# Patient Record
Sex: Male | Born: 1970 | Race: White | Hispanic: No | Marital: Married | Smoking: Never smoker
Health system: Southern US, Community
[De-identification: ages and names within clinical notes are randomized; demographics above are authoritative.]

## PROBLEM LIST (undated history)

## (undated) DIAGNOSIS — R55 Syncope and collapse: Secondary | ICD-10-CM

## (undated) DIAGNOSIS — C6290 Malignant neoplasm of unspecified testis, unspecified whether descended or undescended: Secondary | ICD-10-CM

## (undated) DIAGNOSIS — Z9221 Personal history of antineoplastic chemotherapy: Secondary | ICD-10-CM

## (undated) DIAGNOSIS — D499 Neoplasm of unspecified behavior of unspecified site: Secondary | ICD-10-CM

## (undated) HISTORY — PX: CARDIAC CATHETERIZATION: SHX172

## (undated) HISTORY — DX: Personal history of antineoplastic chemotherapy: Z92.21

## (undated) HISTORY — PX: WISDOM TOOTH EXTRACTION: SHX21

## (undated) HISTORY — PX: OTHER SURGICAL HISTORY: SHX169

## (undated) HISTORY — DX: Malignant neoplasm of unspecified testis, unspecified whether descended or undescended: C62.90

## (undated) HISTORY — DX: Neoplasm of unspecified behavior of unspecified site: D49.9

## (undated) HISTORY — DX: Syncope and collapse: R55

---

## 1998-07-31 ENCOUNTER — Encounter: Payer: Self-pay | Admitting: Emergency Medicine

## 1998-07-31 ENCOUNTER — Emergency Department (HOSPITAL_COMMUNITY): Admission: EM | Admit: 1998-07-31 | Discharge: 1998-07-31 | Payer: Self-pay | Admitting: Emergency Medicine

## 2005-11-29 ENCOUNTER — Ambulatory Visit (HOSPITAL_COMMUNITY): Admission: RE | Admit: 2005-11-29 | Discharge: 2005-11-29 | Payer: Self-pay | Admitting: Urology

## 2005-11-29 ENCOUNTER — Encounter (INDEPENDENT_AMBULATORY_CARE_PROVIDER_SITE_OTHER): Payer: Self-pay | Admitting: Specialist

## 2005-12-12 ENCOUNTER — Ambulatory Visit (HOSPITAL_COMMUNITY): Admission: RE | Admit: 2005-12-12 | Discharge: 2005-12-12 | Payer: Self-pay | Admitting: Urology

## 2005-12-21 ENCOUNTER — Ambulatory Visit: Admission: RE | Admit: 2005-12-21 | Discharge: 2006-02-25 | Payer: Self-pay | Admitting: Radiation Oncology

## 2007-01-30 ENCOUNTER — Ambulatory Visit (HOSPITAL_COMMUNITY): Admission: RE | Admit: 2007-01-30 | Discharge: 2007-01-30 | Payer: Self-pay | Admitting: Urology

## 2007-02-03 ENCOUNTER — Ambulatory Visit: Payer: Self-pay | Admitting: Oncology

## 2007-02-11 LAB — CBC WITH DIFFERENTIAL/PLATELET
BASO%: 0.5 % (ref 0.0–2.0)
EOS%: 4.4 % (ref 0.0–7.0)
HGB: 14.9 g/dL (ref 13.0–17.1)
MCH: 31.3 pg (ref 28.0–33.4)
MCHC: 35.6 g/dL (ref 32.0–35.9)
RDW: 13 % (ref 11.2–14.6)
WBC: 5.1 10*3/uL (ref 4.0–10.0)
lymph#: 1 10*3/uL (ref 0.9–3.3)

## 2007-02-15 LAB — COMPREHENSIVE METABOLIC PANEL
ALT: 34 U/L (ref 0–53)
AST: 23 U/L (ref 0–37)
Albumin: 4.7 g/dL (ref 3.5–5.2)
Calcium: 9.7 mg/dL (ref 8.4–10.5)
Chloride: 105 mEq/L (ref 96–112)
Potassium: 4.2 mEq/L (ref 3.5–5.3)
Sodium: 141 mEq/L (ref 135–145)
Total Protein: 7.5 g/dL (ref 6.0–8.3)

## 2007-02-15 LAB — AFP TUMOR MARKER: AFP-Tumor Marker: 3.8 ng/mL (ref 0.0–8.0)

## 2007-02-17 ENCOUNTER — Ambulatory Visit: Admission: RE | Admit: 2007-02-17 | Discharge: 2007-03-18 | Payer: Self-pay | Admitting: Radiation Oncology

## 2007-03-19 ENCOUNTER — Ambulatory Visit: Admission: RE | Admit: 2007-03-19 | Discharge: 2007-05-24 | Payer: Self-pay | Admitting: Radiation Oncology

## 2007-04-03 ENCOUNTER — Ambulatory Visit (HOSPITAL_COMMUNITY): Admission: RE | Admit: 2007-04-03 | Discharge: 2007-04-03 | Payer: Self-pay | Admitting: Radiation Oncology

## 2007-04-06 LAB — CBC WITH DIFFERENTIAL/PLATELET
Basophils Absolute: 0 10*3/uL (ref 0.0–0.1)
EOS%: 4 % (ref 0.0–7.0)
Eosinophils Absolute: 0.2 10*3/uL (ref 0.0–0.5)
HGB: 14.8 g/dL (ref 13.0–17.1)
LYMPH%: 13.9 % — ABNORMAL LOW (ref 14.0–48.0)
MCH: 31.1 pg (ref 28.0–33.4)
MCV: 87.2 fL (ref 81.6–98.0)
MONO%: 11.4 % (ref 0.0–13.0)
NEUT#: 4.3 10*3/uL (ref 1.5–6.5)
NEUT%: 70.5 % (ref 40.0–75.0)
Platelets: 249 10*3/uL (ref 145–400)

## 2007-04-06 LAB — BASIC METABOLIC PANEL
BUN: 10 mg/dL (ref 6–23)
Calcium: 9.4 mg/dL (ref 8.4–10.5)
Creatinine, Ser: 0.87 mg/dL (ref 0.40–1.50)
Glucose, Bld: 94 mg/dL (ref 70–99)
Potassium: 4.4 mEq/L (ref 3.5–5.3)

## 2007-05-07 LAB — CBC WITH DIFFERENTIAL/PLATELET
BASO%: 0.1 % (ref 0.0–2.0)
EOS%: 2.2 % (ref 0.0–7.0)
HCT: 41.4 % (ref 38.7–49.9)
LYMPH%: 5.5 % — ABNORMAL LOW (ref 14.0–48.0)
MCH: 31.3 pg (ref 28.0–33.4)
MCHC: 35.7 g/dL (ref 32.0–35.9)
NEUT%: 80.5 % — ABNORMAL HIGH (ref 40.0–75.0)
RBC: 4.72 10*6/uL (ref 4.20–5.71)
lymph#: 0.3 10*3/uL — ABNORMAL LOW (ref 0.9–3.3)

## 2007-10-21 ENCOUNTER — Encounter (INDEPENDENT_AMBULATORY_CARE_PROVIDER_SITE_OTHER): Payer: Self-pay | Admitting: Interventional Radiology

## 2007-10-21 ENCOUNTER — Ambulatory Visit (HOSPITAL_COMMUNITY): Admission: RE | Admit: 2007-10-21 | Discharge: 2007-10-21 | Payer: Self-pay | Admitting: Urology

## 2007-10-27 ENCOUNTER — Ambulatory Visit: Payer: Self-pay | Admitting: Oncology

## 2007-11-02 ENCOUNTER — Ambulatory Visit: Admission: RE | Admit: 2007-11-02 | Discharge: 2007-11-02 | Payer: Self-pay | Admitting: Oncology

## 2007-11-06 ENCOUNTER — Ambulatory Visit (HOSPITAL_COMMUNITY): Admission: RE | Admit: 2007-11-06 | Discharge: 2007-11-06 | Payer: Self-pay | Admitting: Oncology

## 2007-11-16 LAB — CBC WITH DIFFERENTIAL/PLATELET
BASO%: 1.2 % (ref 0.0–2.0)
EOS%: 10.1 % — ABNORMAL HIGH (ref 0.0–7.0)
HCT: 40.3 % (ref 38.7–49.9)
LYMPH%: 8.3 % — ABNORMAL LOW (ref 14.0–48.0)
MCH: 30.9 pg (ref 28.0–33.4)
MCHC: 35.1 g/dL (ref 32.0–35.9)
MONO%: 9.9 % (ref 0.0–13.0)
NEUT%: 70.5 % (ref 40.0–75.0)
Platelets: 182 10*3/uL (ref 145–400)

## 2007-11-16 LAB — COMPREHENSIVE METABOLIC PANEL
ALT: 32 U/L (ref 0–53)
AST: 23 U/L (ref 0–37)
CO2: 24 mEq/L (ref 19–32)
Calcium: 9.7 mg/dL (ref 8.4–10.5)
Chloride: 106 mEq/L (ref 96–112)
Creatinine, Ser: 0.77 mg/dL (ref 0.40–1.50)
Potassium: 4.2 mEq/L (ref 3.5–5.3)
Sodium: 138 mEq/L (ref 135–145)
Total Protein: 6.8 g/dL (ref 6.0–8.3)

## 2007-11-24 LAB — CBC WITH DIFFERENTIAL/PLATELET
Basophils Absolute: 0 10*3/uL (ref 0.0–0.1)
EOS%: 0.7 % (ref 0.0–7.0)
Eosinophils Absolute: 0 10*3/uL (ref 0.0–0.5)
HCT: 36.6 % — ABNORMAL LOW (ref 38.7–49.9)
HGB: 13.2 g/dL (ref 13.0–17.1)
MCH: 30.8 pg (ref 28.0–33.4)
MONO#: 0.1 10*3/uL (ref 0.1–0.9)
NEUT#: 5.3 10*3/uL (ref 1.5–6.5)
NEUT%: 92.3 % — ABNORMAL HIGH (ref 40.0–75.0)
lymph#: 0.3 10*3/uL — ABNORMAL LOW (ref 0.9–3.3)

## 2007-11-24 LAB — COMPREHENSIVE METABOLIC PANEL
Albumin: 4.3 g/dL (ref 3.5–5.2)
BUN: 20 mg/dL (ref 6–23)
CO2: 21 mEq/L (ref 19–32)
Calcium: 9 mg/dL (ref 8.4–10.5)
Chloride: 101 mEq/L (ref 96–112)
Creatinine, Ser: 0.81 mg/dL (ref 0.40–1.50)
Glucose, Bld: 124 mg/dL — ABNORMAL HIGH (ref 70–99)
Potassium: 3.9 mEq/L (ref 3.5–5.3)

## 2007-11-26 LAB — CBC WITH DIFFERENTIAL/PLATELET
BASO%: 1.3 % (ref 0.0–2.0)
Basophils Absolute: 0 10*3/uL (ref 0.0–0.1)
EOS%: 2.2 % (ref 0.0–7.0)
HCT: 33.9 % — ABNORMAL LOW (ref 38.7–49.9)
HGB: 12.1 g/dL — ABNORMAL LOW (ref 13.0–17.1)
MONO#: 0 10*3/uL — ABNORMAL LOW (ref 0.1–0.9)
NEUT%: 65.2 % (ref 40.0–75.0)
RDW: 12.7 % (ref 11.2–14.6)
WBC: 0.8 10*3/uL — CL (ref 4.0–10.0)
lymph#: 0.2 10*3/uL — ABNORMAL LOW (ref 0.9–3.3)

## 2007-11-26 LAB — COMPREHENSIVE METABOLIC PANEL
AST: 21 U/L (ref 0–37)
Albumin: 4.2 g/dL (ref 3.5–5.2)
BUN: 16 mg/dL (ref 6–23)
Calcium: 9.2 mg/dL (ref 8.4–10.5)
Chloride: 104 mEq/L (ref 96–112)
Glucose, Bld: 129 mg/dL — ABNORMAL HIGH (ref 70–99)
Potassium: 4.4 mEq/L (ref 3.5–5.3)

## 2007-12-01 LAB — CBC WITH DIFFERENTIAL/PLATELET
BASO%: 0.7 % (ref 0.0–2.0)
Basophils Absolute: 0 10*3/uL (ref 0.0–0.1)
EOS%: 1.2 % (ref 0.0–7.0)
HGB: 12.4 g/dL — ABNORMAL LOW (ref 13.0–17.1)
MCH: 31.7 pg (ref 28.0–33.4)
RDW: 13.1 % (ref 11.2–14.6)
lymph#: 0.3 10*3/uL — ABNORMAL LOW (ref 0.9–3.3)

## 2007-12-02 ENCOUNTER — Ambulatory Visit: Admission: RE | Admit: 2007-12-02 | Discharge: 2007-12-02 | Payer: Self-pay | Admitting: Oncology

## 2007-12-05 LAB — COMPREHENSIVE METABOLIC PANEL
ALT: 48 U/L (ref 0–53)
AST: 30 U/L (ref 0–37)
Albumin: 4.2 g/dL (ref 3.5–5.2)
BUN: 12 mg/dL (ref 6–23)
Calcium: 9.2 mg/dL (ref 8.4–10.5)
Chloride: 105 mEq/L (ref 96–112)
Potassium: 4.2 mEq/L (ref 3.5–5.3)
Sodium: 140 mEq/L (ref 135–145)
Total Protein: 6.5 g/dL (ref 6.0–8.3)

## 2007-12-07 ENCOUNTER — Ambulatory Visit: Payer: Self-pay | Admitting: Oncology

## 2007-12-07 LAB — CBC WITH DIFFERENTIAL/PLATELET
BASO%: 0.4 % (ref 0.0–2.0)
LYMPH%: 7.5 % — ABNORMAL LOW (ref 14.0–48.0)
MCH: 31.5 pg (ref 28.0–33.4)
MCHC: 35.9 g/dL (ref 32.0–35.9)
MCV: 87.8 fL (ref 81.6–98.0)
MONO%: 5.8 % (ref 0.0–13.0)
NEUT%: 84.5 % — ABNORMAL HIGH (ref 40.0–75.0)
Platelets: 193 10*3/uL (ref 145–400)
RBC: 4.14 10*6/uL — ABNORMAL LOW (ref 4.20–5.71)

## 2007-12-09 LAB — COMPREHENSIVE METABOLIC PANEL
ALT: 51 U/L (ref 0–53)
Alkaline Phosphatase: 54 U/L (ref 39–117)
Creatinine, Ser: 0.83 mg/dL (ref 0.40–1.50)
Glucose, Bld: 146 mg/dL — ABNORMAL HIGH (ref 70–99)
Sodium: 139 mEq/L (ref 135–145)
Total Bilirubin: 0.3 mg/dL (ref 0.3–1.2)
Total Protein: 6.7 g/dL (ref 6.0–8.3)

## 2007-12-09 LAB — AFP TUMOR MARKER: AFP-Tumor Marker: 5.4 ng/mL (ref 0.0–8.0)

## 2007-12-15 LAB — CBC WITH DIFFERENTIAL/PLATELET
BASO%: 0.1 % (ref 0.0–2.0)
Basophils Absolute: 0 10*3/uL (ref 0.0–0.1)
EOS%: 0.1 % (ref 0.0–7.0)
HGB: 11.8 g/dL — ABNORMAL LOW (ref 13.0–17.1)
MCH: 31.6 pg (ref 28.0–33.4)
MONO#: 0.1 10*3/uL (ref 0.1–0.9)
RDW: 13.3 % (ref 11.2–14.6)
WBC: 17.5 10*3/uL — ABNORMAL HIGH (ref 4.0–10.0)
lymph#: 0.3 10*3/uL — ABNORMAL LOW (ref 0.9–3.3)

## 2007-12-17 LAB — COMPREHENSIVE METABOLIC PANEL
ALT: 30 U/L (ref 0–53)
AST: 14 U/L (ref 0–37)
Albumin: 4.1 g/dL (ref 3.5–5.2)
BUN: 20 mg/dL (ref 6–23)
CO2: 24 mEq/L (ref 19–32)
Calcium: 9.3 mg/dL (ref 8.4–10.5)
Chloride: 97 mEq/L (ref 96–112)
Potassium: 3.9 mEq/L (ref 3.5–5.3)

## 2007-12-21 ENCOUNTER — Ambulatory Visit: Admission: RE | Admit: 2007-12-21 | Discharge: 2007-12-21 | Payer: Self-pay | Admitting: Oncology

## 2007-12-22 LAB — CBC WITH DIFFERENTIAL/PLATELET
BASO%: 0.4 % (ref 0.0–2.0)
Basophils Absolute: 0 10*3/uL (ref 0.0–0.1)
Eosinophils Absolute: 0 10*3/uL (ref 0.0–0.5)
HCT: 29.1 % — ABNORMAL LOW (ref 38.7–49.9)
HGB: 10.4 g/dL — ABNORMAL LOW (ref 13.0–17.1)
MONO#: 0.4 10*3/uL (ref 0.1–0.9)
NEUT#: 3.5 10*3/uL (ref 1.5–6.5)
NEUT%: 82.3 % — ABNORMAL HIGH (ref 40.0–75.0)
WBC: 4.2 10*3/uL (ref 4.0–10.0)
lymph#: 0.3 10*3/uL — ABNORMAL LOW (ref 0.9–3.3)

## 2007-12-25 LAB — LACTATE DEHYDROGENASE: LDH: 355 U/L — ABNORMAL HIGH (ref 94–250)

## 2007-12-25 LAB — COMPREHENSIVE METABOLIC PANEL
ALT: 37 U/L (ref 0–53)
BUN: 9 mg/dL (ref 6–23)
CO2: 23 mEq/L (ref 19–32)
Calcium: 9.3 mg/dL (ref 8.4–10.5)
Chloride: 108 mEq/L (ref 96–112)
Creatinine, Ser: 0.87 mg/dL (ref 0.40–1.50)
Glucose, Bld: 90 mg/dL (ref 70–99)

## 2007-12-25 LAB — AFP TUMOR MARKER: AFP-Tumor Marker: 4.9 ng/mL (ref 0.0–8.0)

## 2007-12-28 LAB — CBC WITH DIFFERENTIAL/PLATELET
Basophils Absolute: 0.1 10*3/uL (ref 0.0–0.1)
HCT: 30.8 % — ABNORMAL LOW (ref 38.7–49.9)
HGB: 10.9 g/dL — ABNORMAL LOW (ref 13.0–17.1)
MONO#: 0.8 10*3/uL (ref 0.1–0.9)
NEUT%: 86.8 % — ABNORMAL HIGH (ref 40.0–75.0)
WBC: 11.3 10*3/uL — ABNORMAL HIGH (ref 4.0–10.0)
lymph#: 0.6 10*3/uL — ABNORMAL LOW (ref 0.9–3.3)

## 2007-12-30 LAB — BETA HCG QUANT (REF LAB): Beta hCG, Tumor Marker: 0.5 m[IU]/mL (ref <5.0)

## 2007-12-30 LAB — COMPREHENSIVE METABOLIC PANEL
Albumin: 4.1 g/dL (ref 3.5–5.2)
Alkaline Phosphatase: 55 U/L (ref 39–117)
BUN: 14 mg/dL (ref 6–23)
Calcium: 9.2 mg/dL (ref 8.4–10.5)
Chloride: 106 mEq/L (ref 96–112)
Glucose, Bld: 116 mg/dL — ABNORMAL HIGH (ref 70–99)
Potassium: 3.9 mEq/L (ref 3.5–5.3)
Sodium: 139 mEq/L (ref 135–145)
Total Protein: 6.4 g/dL (ref 6.0–8.3)

## 2007-12-30 LAB — AFP TUMOR MARKER: AFP-Tumor Marker: 4 ng/mL (ref 0.0–8.0)

## 2008-01-05 LAB — COMPREHENSIVE METABOLIC PANEL
AST: 8 U/L (ref 0–37)
Alkaline Phosphatase: 83 U/L (ref 39–117)
BUN: 19 mg/dL (ref 6–23)
Calcium: 6.9 mg/dL — ABNORMAL LOW (ref 8.4–10.5)
Chloride: 111 mEq/L (ref 96–112)
Creatinine, Ser: 0.67 mg/dL (ref 0.40–1.50)

## 2008-01-05 LAB — CBC WITH DIFFERENTIAL/PLATELET
Basophils Absolute: 0 10*3/uL (ref 0.0–0.1)
EOS%: 0 % (ref 0.0–7.0)
HCT: 28.2 % — ABNORMAL LOW (ref 38.7–49.9)
HGB: 10.1 g/dL — ABNORMAL LOW (ref 13.0–17.1)
MCH: 31.6 pg (ref 28.0–33.4)
MCV: 88.2 fL (ref 81.6–98.0)
MONO%: 1.3 % (ref 0.0–13.0)
NEUT%: 97.3 % — ABNORMAL HIGH (ref 40.0–75.0)
Platelets: 132 10*3/uL — ABNORMAL LOW (ref 145–400)

## 2008-01-12 ENCOUNTER — Ambulatory Visit (HOSPITAL_COMMUNITY): Admission: RE | Admit: 2008-01-12 | Discharge: 2008-01-12 | Payer: Self-pay | Admitting: Oncology

## 2008-01-12 LAB — CBC WITH DIFFERENTIAL/PLATELET
Basophils Absolute: 0 10*3/uL (ref 0.0–0.1)
EOS%: 0.7 % (ref 0.0–7.0)
HCT: 26.1 % — ABNORMAL LOW (ref 38.7–49.9)
HGB: 9.3 g/dL — ABNORMAL LOW (ref 13.0–17.1)
MCH: 32 pg (ref 28.0–33.4)
NEUT%: 75.9 % — ABNORMAL HIGH (ref 40.0–75.0)
lymph#: 0.3 10*3/uL — ABNORMAL LOW (ref 0.9–3.3)

## 2008-01-14 LAB — COMPREHENSIVE METABOLIC PANEL
BUN: 15 mg/dL (ref 6–23)
CO2: 23 mEq/L (ref 19–32)
Calcium: 9.5 mg/dL (ref 8.4–10.5)
Chloride: 105 mEq/L (ref 96–112)
Creatinine, Ser: 0.93 mg/dL (ref 0.40–1.50)
Glucose, Bld: 156 mg/dL — ABNORMAL HIGH (ref 70–99)

## 2008-01-14 LAB — BETA HCG QUANT (REF LAB): Beta hCG, Tumor Marker: 1 m[IU]/mL (ref <5.0)

## 2008-01-14 LAB — LACTATE DEHYDROGENASE: LDH: 310 U/L — ABNORMAL HIGH (ref 94–250)

## 2008-01-19 LAB — CBC WITH DIFFERENTIAL/PLATELET
BASO%: 0.1 % (ref 0.0–2.0)
Basophils Absolute: 0 10*3/uL (ref 0.0–0.1)
EOS%: 3.3 % (ref 0.0–7.0)
HGB: 10 g/dL — ABNORMAL LOW (ref 13.0–17.1)
MCH: 31.8 pg (ref 28.0–33.4)
MCV: 91 fL (ref 81.6–98.0)
MONO%: 9.5 % (ref 0.0–13.0)
RBC: 3.15 10*6/uL — ABNORMAL LOW (ref 4.20–5.71)
RDW: 18.7 % — ABNORMAL HIGH (ref 11.2–14.6)
lymph#: 0.5 10*3/uL — ABNORMAL LOW (ref 0.9–3.3)

## 2008-01-19 LAB — COMPREHENSIVE METABOLIC PANEL
ALT: 27 U/L (ref 0–53)
AST: 20 U/L (ref 0–37)
Albumin: 4.5 g/dL (ref 3.5–5.2)
Alkaline Phosphatase: 61 U/L (ref 39–117)
BUN: 14 mg/dL (ref 6–23)
Calcium: 9.6 mg/dL (ref 8.4–10.5)
Chloride: 104 mEq/L (ref 96–112)
Potassium: 4.2 mEq/L (ref 3.5–5.3)
Sodium: 140 mEq/L (ref 135–145)
Total Protein: 6.7 g/dL (ref 6.0–8.3)

## 2008-02-01 ENCOUNTER — Ambulatory Visit (HOSPITAL_COMMUNITY): Admission: RE | Admit: 2008-02-01 | Discharge: 2008-02-01 | Payer: Self-pay | Admitting: Oncology

## 2008-02-03 ENCOUNTER — Ambulatory Visit: Payer: Self-pay | Admitting: Oncology

## 2008-02-05 LAB — CBC WITH DIFFERENTIAL/PLATELET
BASO%: 0.3 % (ref 0.0–2.0)
Basophils Absolute: 0 10*3/uL (ref 0.0–0.1)
EOS%: 13.8 % — ABNORMAL HIGH (ref 0.0–7.0)
HGB: 11.3 g/dL — ABNORMAL LOW (ref 13.0–17.1)
MCH: 33 pg (ref 28.0–33.4)
MCHC: 34.7 g/dL (ref 32.0–35.9)
MCV: 94.8 fL (ref 81.6–98.0)
MONO%: 6.8 % (ref 0.0–13.0)
RDW: 18.1 % — ABNORMAL HIGH (ref 11.2–14.6)
lymph#: 0.5 10*3/uL — ABNORMAL LOW (ref 0.9–3.3)

## 2008-02-08 LAB — COMPREHENSIVE METABOLIC PANEL
AST: 20 U/L (ref 0–37)
Alkaline Phosphatase: 58 U/L (ref 39–117)
BUN: 18 mg/dL (ref 6–23)
Glucose, Bld: 90 mg/dL (ref 70–99)
Potassium: 4.4 mEq/L (ref 3.5–5.3)
Sodium: 140 mEq/L (ref 135–145)
Total Bilirubin: 0.5 mg/dL (ref 0.3–1.2)
Total Protein: 6.7 g/dL (ref 6.0–8.3)

## 2008-02-08 LAB — BETA HCG QUANT (REF LAB): Beta hCG, Tumor Marker: <0.5 m[IU]/mL (ref <5.0)

## 2008-04-04 ENCOUNTER — Ambulatory Visit: Payer: Self-pay | Admitting: Oncology

## 2008-04-11 ENCOUNTER — Ambulatory Visit: Payer: Self-pay | Admitting: Oncology

## 2008-04-13 LAB — CBC WITH DIFFERENTIAL/PLATELET
Eosinophils Absolute: 0.2 10*3/uL (ref 0.0–0.5)
MONO#: 0.2 10*3/uL (ref 0.1–0.9)
NEUT#: 4.2 10*3/uL (ref 1.5–6.5)
RBC: 4.31 10*6/uL (ref 4.20–5.71)
RDW: 12.7 % (ref 11.2–14.6)
WBC: 5.1 10*3/uL (ref 4.0–10.0)
lymph#: 0.5 10*3/uL — ABNORMAL LOW (ref 0.9–3.3)

## 2008-04-13 LAB — COMPREHENSIVE METABOLIC PANEL
ALT: 31 U/L (ref 0–53)
Albumin: 4.2 g/dL (ref 3.5–5.2)
CO2: 28 mEq/L (ref 19–32)
Glucose, Bld: 138 mg/dL — ABNORMAL HIGH (ref 70–99)
Potassium: 3.8 mEq/L (ref 3.5–5.3)
Sodium: 140 mEq/L (ref 135–145)
Total Protein: 7.2 g/dL (ref 6.0–8.3)

## 2008-04-13 LAB — LACTATE DEHYDROGENASE: LDH: 161 U/L (ref 94–250)

## 2008-04-17 LAB — AFP TUMOR MARKER: AFP-Tumor Marker: 5.2 ng/mL (ref 0.0–8.0)

## 2008-05-23 ENCOUNTER — Ambulatory Visit: Payer: Self-pay | Admitting: Oncology

## 2008-06-07 LAB — CBC WITH DIFFERENTIAL/PLATELET
Basophils Absolute: 0 10*3/uL (ref 0.0–0.1)
Eosinophils Absolute: 0.2 10*3/uL (ref 0.0–0.5)
HGB: 12.4 g/dL — ABNORMAL LOW (ref 13.0–17.1)
LYMPH%: 9.9 % — ABNORMAL LOW (ref 14.0–49.0)
MCV: 89.7 fL (ref 79.3–98.0)
MONO#: 0.3 10*3/uL (ref 0.1–0.9)
MONO%: 5.7 % (ref 0.0–14.0)
NEUT#: 4.3 10*3/uL (ref 1.5–6.5)
Platelets: 145 10*3/uL (ref 140–400)
RBC: 3.98 10*6/uL — ABNORMAL LOW (ref 4.20–5.82)
WBC: 5.4 10*3/uL (ref 4.0–10.3)

## 2008-06-07 LAB — TSH: TSH: 1.395 u[IU]/mL (ref 0.350–4.500)

## 2008-06-07 LAB — T4: T4, Total: 9.8 ug/dL (ref 5.0–12.5)

## 2008-06-10 LAB — COMPREHENSIVE METABOLIC PANEL
AST: 13 U/L (ref 0–37)
Albumin: 4.7 g/dL (ref 3.5–5.2)
Alkaline Phosphatase: 53 U/L (ref 39–117)
BUN: 12 mg/dL (ref 6–23)
Creatinine, Ser: 0.95 mg/dL (ref 0.40–1.50)
Potassium: 4.3 mEq/L (ref 3.5–5.3)

## 2008-06-10 LAB — BETA HCG QUANT (REF LAB): Beta hCG, Tumor Marker: <0.5 m[IU]/mL (ref <5.0)

## 2008-07-08 ENCOUNTER — Ambulatory Visit: Payer: Self-pay | Admitting: Oncology

## 2008-07-12 ENCOUNTER — Ambulatory Visit (HOSPITAL_COMMUNITY): Admission: RE | Admit: 2008-07-12 | Discharge: 2008-07-12 | Payer: Self-pay | Admitting: Oncology

## 2008-07-12 LAB — COMPREHENSIVE METABOLIC PANEL
Alkaline Phosphatase: 54 U/L (ref 39–117)
BUN: 15 mg/dL (ref 6–23)
CO2: 27 mEq/L (ref 19–32)
Creatinine, Ser: 0.9 mg/dL (ref 0.40–1.50)
Glucose, Bld: 86 mg/dL (ref 70–99)
Sodium: 139 mEq/L (ref 135–145)
Total Bilirubin: 0.6 mg/dL (ref 0.3–1.2)
Total Protein: 7.4 g/dL (ref 6.0–8.3)

## 2008-07-12 LAB — CBC WITH DIFFERENTIAL/PLATELET
Basophils Absolute: 0 10*3/uL (ref 0.0–0.1)
Eosinophils Absolute: 0.2 10*3/uL (ref 0.0–0.5)
HCT: 39.2 % (ref 38.4–49.9)
HGB: 13.6 g/dL (ref 13.0–17.1)
LYMPH%: 9.8 % — ABNORMAL LOW (ref 14.0–49.0)
MCV: 90.6 fL (ref 79.3–98.0)
MONO#: 0.4 10*3/uL (ref 0.1–0.9)
MONO%: 6.4 % (ref 0.0–14.0)
NEUT#: 4.7 10*3/uL (ref 1.5–6.5)
NEUT%: 80.9 % — ABNORMAL HIGH (ref 39.0–75.0)
Platelets: 196 10*3/uL (ref 140–400)
RBC: 4.33 10*6/uL (ref 4.20–5.82)
WBC: 5.8 10*3/uL (ref 4.0–10.3)

## 2008-08-17 ENCOUNTER — Ambulatory Visit (HOSPITAL_COMMUNITY): Admission: RE | Admit: 2008-08-17 | Discharge: 2008-08-17 | Payer: Self-pay | Admitting: Oncology

## 2008-11-02 ENCOUNTER — Ambulatory Visit: Payer: Self-pay | Admitting: Oncology

## 2009-01-31 ENCOUNTER — Ambulatory Visit: Payer: Self-pay | Admitting: Oncology

## 2009-02-02 LAB — CBC WITH DIFFERENTIAL/PLATELET
BASO%: 0 % (ref 0.0–2.0)
Eosinophils Absolute: 0.1 10*3/uL (ref 0.0–0.5)
MCHC: 34.3 g/dL (ref 32.0–36.0)
MONO#: 0.4 10*3/uL (ref 0.1–0.9)
MONO%: 6.1 % (ref 0.0–14.0)
NEUT#: 5.3 10*3/uL (ref 1.5–6.5)
RBC: 4.28 10*6/uL (ref 4.20–5.82)
RDW: 13.2 % (ref 11.0–14.6)
WBC: 6.4 10*3/uL (ref 4.0–10.3)

## 2009-02-04 LAB — COMPREHENSIVE METABOLIC PANEL
ALT: 19 U/L (ref 0–53)
Albumin: 4.8 g/dL (ref 3.5–5.2)
Alkaline Phosphatase: 51 U/L (ref 39–117)
Glucose, Bld: 90 mg/dL (ref 70–99)
Potassium: 4.1 mEq/L (ref 3.5–5.3)
Sodium: 142 mEq/L (ref 135–145)
Total Bilirubin: 0.4 mg/dL (ref 0.3–1.2)
Total Protein: 6.9 g/dL (ref 6.0–8.3)

## 2009-02-04 LAB — AFP TUMOR MARKER: AFP-Tumor Marker: 4.8 ng/mL (ref 0.0–8.0)

## 2009-05-08 ENCOUNTER — Ambulatory Visit: Payer: Self-pay | Admitting: Oncology

## 2009-05-22 ENCOUNTER — Ambulatory Visit (HOSPITAL_COMMUNITY): Admission: RE | Admit: 2009-05-22 | Discharge: 2009-05-22 | Payer: Self-pay | Admitting: Oncology

## 2009-05-26 LAB — CBC WITH DIFFERENTIAL/PLATELET
Basophils Absolute: 0 10*3/uL (ref 0.0–0.1)
EOS%: 2.5 % (ref 0.0–7.0)
Eosinophils Absolute: 0.1 10*3/uL (ref 0.0–0.5)
HCT: 40.5 % (ref 38.4–49.9)
LYMPH%: 14 % (ref 14.0–49.0)
MCV: 92.2 fL (ref 79.3–98.0)
MONO#: 0.3 10*3/uL (ref 0.1–0.9)
MONO%: 6.1 % (ref 0.0–14.0)
NEUT#: 3.8 10*3/uL (ref 1.5–6.5)
NEUT%: 77.1 % — ABNORMAL HIGH (ref 39.0–75.0)
Platelets: 163 10*3/uL (ref 140–400)
RDW: 13.1 % (ref 11.0–14.6)

## 2009-05-29 ENCOUNTER — Ambulatory Visit (HOSPITAL_COMMUNITY): Admission: RE | Admit: 2009-05-29 | Discharge: 2009-05-29 | Payer: Self-pay | Admitting: Oncology

## 2009-05-30 LAB — LACTATE DEHYDROGENASE: LDH: 165 U/L (ref 94–250)

## 2009-05-30 LAB — COMPREHENSIVE METABOLIC PANEL
Albumin: 5 g/dL (ref 3.5–5.2)
CO2: 23 mEq/L (ref 19–32)
Calcium: 10 mg/dL (ref 8.4–10.5)
Glucose, Bld: 95 mg/dL (ref 70–99)
Potassium: 4.2 mEq/L (ref 3.5–5.3)
Sodium: 139 mEq/L (ref 135–145)
Total Protein: 7.4 g/dL (ref 6.0–8.3)

## 2009-05-30 LAB — BETA HCG QUANT (REF LAB): Beta hCG, Tumor Marker: 0.8 m[IU]/mL (ref <5.0)

## 2009-07-11 IMAGING — CT CT NECK W/ CM
4 of 9 series · 13 of 33 positions shown, 14 images · IV contrast (agent unspecified)
Comparison: Chest CT 01/12/2008 PET 01/30/2007

CLINICAL DATA: History of testicular cancer diagnosed 9551.
Chemotherapy and radiation therapy complete.  Shortness of breath.

CT NECK WITH CONTRAST
TECHNIQUE: Multidetector CT imaging of the neck was performed using
the standard protocol following the bolus administration of
intravenous contrast.
Contrast: 100 ml [id number]
TECHNIQUE: Multidetector CT imaging of the abdomen and pelvis was
performed following the standard protocol following the bolus
administration of intravenous contrast.
CT ABDOMEN

[Series 2: abd_pel 5.0 b40s · axial · 0.81mm/px · z∈[-732,-412]mm · 4 of 108 slices shown, 5 images]
[im 22/108  soft-tissue]
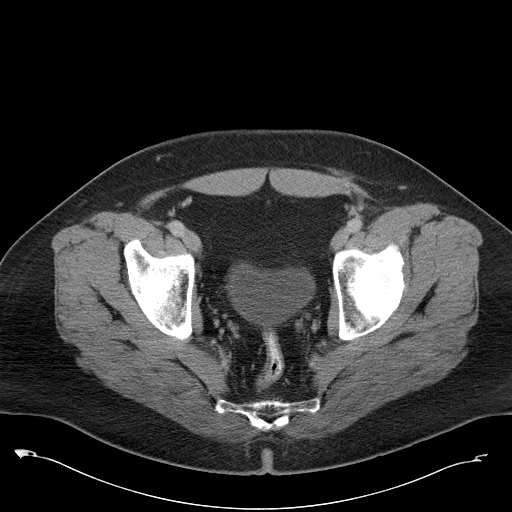
[im 22/108  bone]
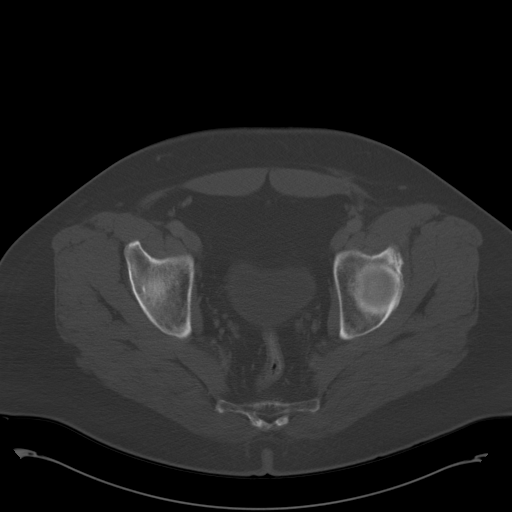
[im 43/108  bone]
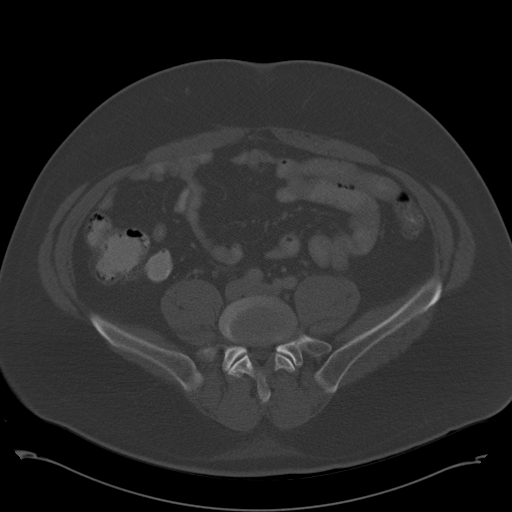
[im 65/108  bone]
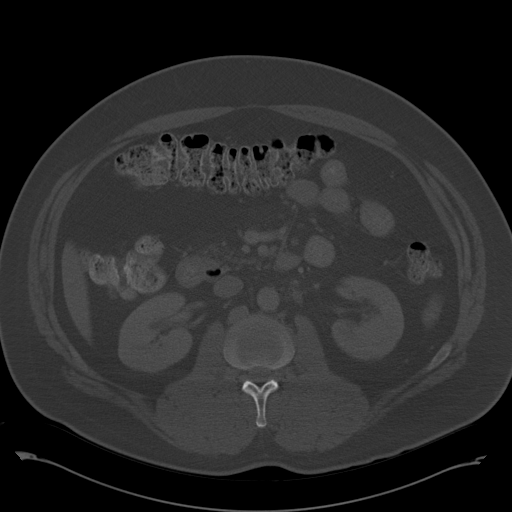
[im 86/108  bone]
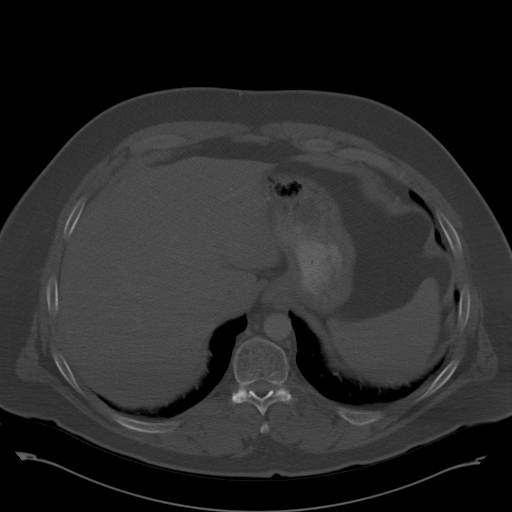

[Series 3: neck 3.0 b40s · axial · 0.39mm/px · z∈[-170,-80]mm · 2 of 90 slices shown]
[im 30/90  bone]
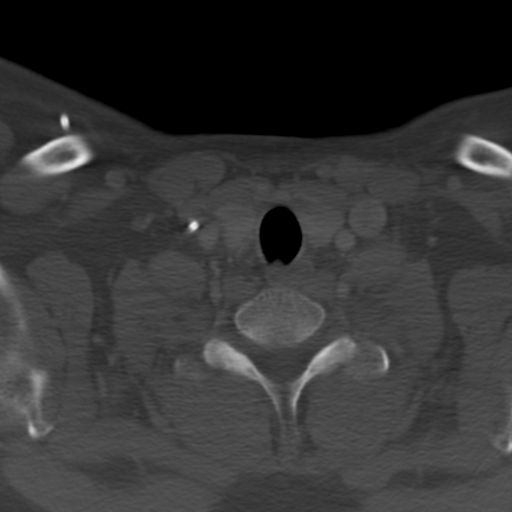
[im 60/90  bone]
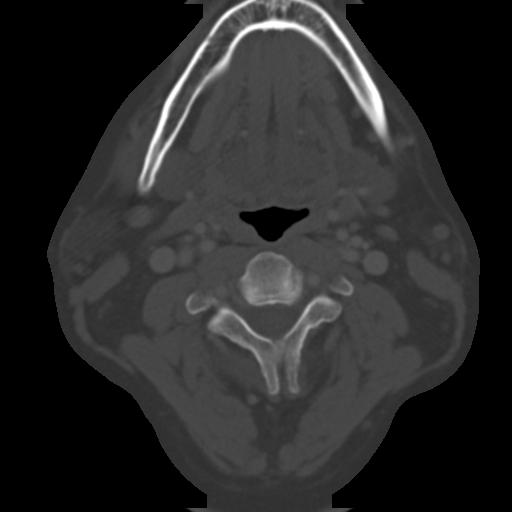

[Series 604: <mpr thick range(2)> · coronal · 1.05mm/px · 2 of 104 slices shown]
[im 35/104  bone]
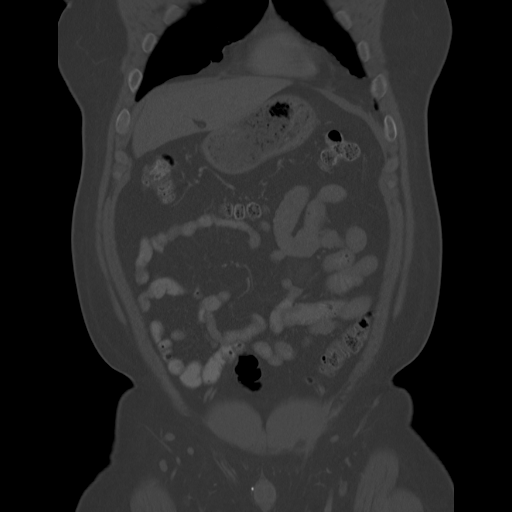
[im 69/104  bone]
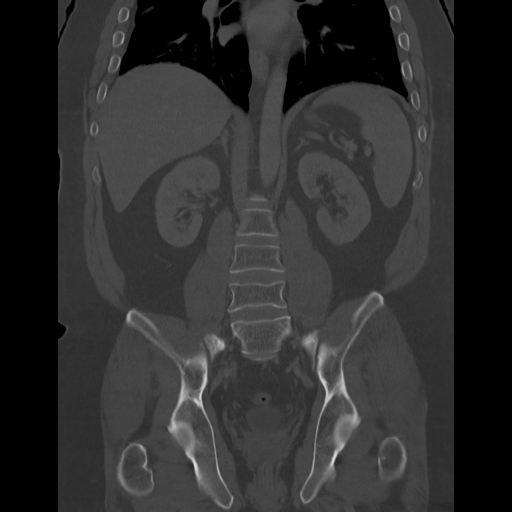

[Series 605: <mpr thick range(3)> · sagittal · 1.05mm/px · 5 of 124 slices shown]
[im 18/124  bone]
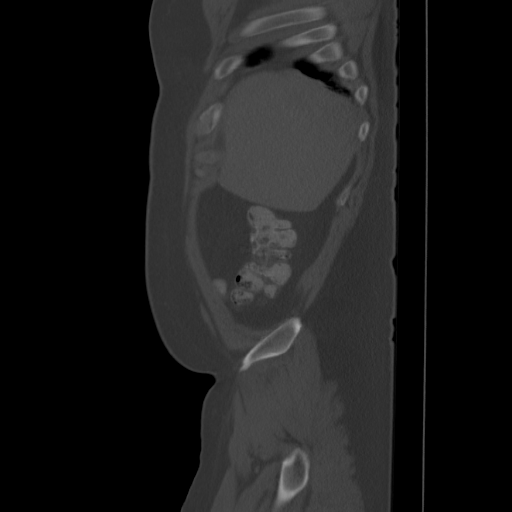
[im 36/124  bone]
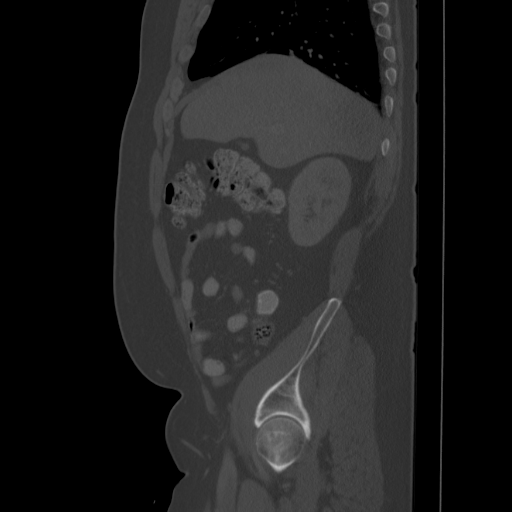
[im 53/124  bone]
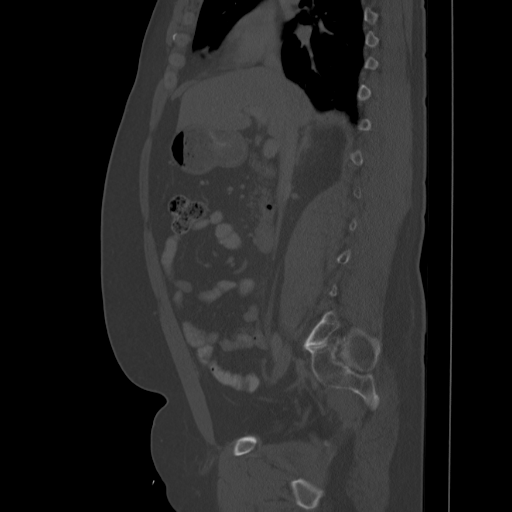
[im 71/124  bone]
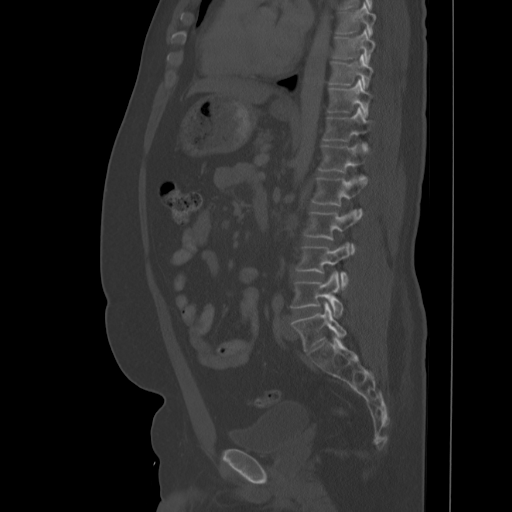
[im 88/124  bone]
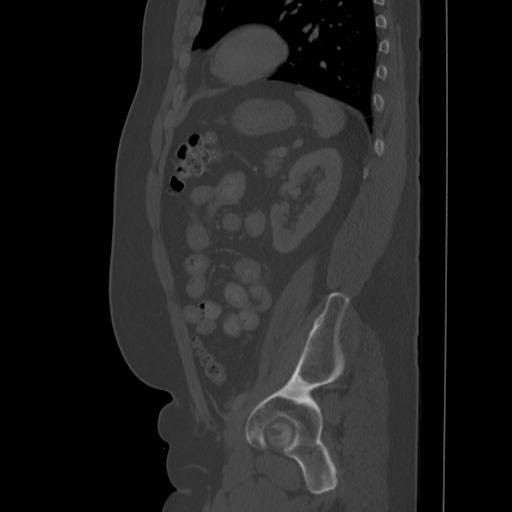

[13 of 33 positions shown; findings below may reference images not displayed]

FINDINGS: Limited intracranial imaging is normal.  Visualized
portions of the orbits and globes within normal limits.  Normal
nasopharynx, hypopharynx, and larynx.

Thyroid gland, submandibular glands, and parotid glands enhance
symmetrically.  No adenopathy.  All vascular structures enhance
normally.  The mastoid air cells are hypoaerated. A right-sided
Port-A-Cath is incompletely imaged.  No upper thoracic adenopathy.
Clear lung apices.
IMPRESSION: 1.  No acute process or evidence of metastatic disease in the neck.

CT ABDOMEN AND PELVIS WITH CONTRAST
FINDINGS: Clear lung bases.  Normal heart size without pericardial
or pleural effusion.

Normal liver, spleen, stomach,.  The pancreatic neck and head are
mildly atrophic and fatty replaced.  Tiny duodenal diverticulum.
The gallbladder is contracted without biliary ductal dilatation.

Normal adrenal glands and kidneys.  Marked improvement in
retroperitoneal adenopathy.  A left para-aortic node measures
cm on image 48 and is decreased from 3.3 x 2.1 cm on the prior
exam.  Immediately anterior is a sub cm node which is also markedly
decreased.  No retrocrural adenopathy.

Normal colon, appendix, and terminal ileum.

Small lymph nodes at the root of the small bowel mesentery.
Increased density within the mesenteric fat.  Findings are similar
to on the prior exam.  Otherwise normal small bowel without
ascites.
IMPRESSION: 1.  Marked response to therapy of retroperitoneal adenopathy.
2.  No new or progressive disease.
3.  Probable mesenteric adenitis/panniculitis.  Similar to on the
prior exam.
4.  Nonspecific contracted gallbladder.  Correlate with right upper
quadrant symptoms.

CT PELVIS
FINDINGS: Normal pelvic bowel loops.  No pelvic adenopathy.
Normal urinary bladder and prostate.  Left orchiectomy. No ascites.
Transitional lumbosacral vertebral body.  Numerous thoracic and
lumbar vertebral bodies are decreased in height relative to their
more cephalad neighbors.  Examples include L2 ,  L3 and T8.  This
may be within normal variation for this patient.
IMPRESSION: 1.  No acute process or evidence of metastatic disease in the
pelvis.
2.  Mildly decreased vertebral body height at numerous levels.
Question within normal variation for this patient.  No evidence of
underlying lesion.

## 2009-08-31 ENCOUNTER — Inpatient Hospital Stay (HOSPITAL_BASED_OUTPATIENT_CLINIC_OR_DEPARTMENT_OTHER): Admission: RE | Admit: 2009-08-31 | Discharge: 2009-08-31 | Payer: Self-pay | Admitting: Interventional Cardiology

## 2009-09-04 ENCOUNTER — Inpatient Hospital Stay (HOSPITAL_COMMUNITY): Admission: AD | Admit: 2009-09-04 | Discharge: 2009-09-07 | Payer: Self-pay | Admitting: Interventional Cardiology

## 2009-09-15 ENCOUNTER — Ambulatory Visit: Payer: Self-pay | Admitting: Oncology

## 2009-09-27 ENCOUNTER — Encounter: Admission: RE | Admit: 2009-09-27 | Discharge: 2009-09-27 | Payer: Self-pay | Admitting: Interventional Radiology

## 2009-10-12 ENCOUNTER — Ambulatory Visit (HOSPITAL_COMMUNITY): Admission: RE | Admit: 2009-10-12 | Discharge: 2009-10-12 | Payer: Self-pay | Admitting: Oncology

## 2009-10-12 LAB — CBC WITH DIFFERENTIAL/PLATELET
Basophils Absolute: 0 10*3/uL (ref 0.0–0.1)
EOS%: 1.8 % (ref 0.0–7.0)
HCT: 39.1 % (ref 38.4–49.9)
HGB: 13.8 g/dL (ref 13.0–17.1)
LYMPH%: 14.1 % (ref 14.0–49.0)
MCH: 31.6 pg (ref 27.2–33.4)
MCHC: 35.3 g/dL (ref 32.0–36.0)
MONO#: 0.3 10*3/uL (ref 0.1–0.9)
MONO%: 6.3 % (ref 0.0–14.0)
NEUT#: 4.1 10*3/uL (ref 1.5–6.5)
RDW: 13.9 % (ref 11.0–14.6)
WBC: 5.3 10*3/uL (ref 4.0–10.3)
lymph#: 0.7 10*3/uL — ABNORMAL LOW (ref 0.9–3.3)

## 2009-10-14 LAB — COMPREHENSIVE METABOLIC PANEL
Alkaline Phosphatase: 57 U/L (ref 39–117)
BUN: 14 mg/dL (ref 6–23)
Creatinine, Ser: 0.9 mg/dL (ref 0.40–1.50)

## 2010-02-23 ENCOUNTER — Ambulatory Visit: Payer: Self-pay | Admitting: Oncology

## 2010-02-27 ENCOUNTER — Ambulatory Visit (HOSPITAL_COMMUNITY): Admission: RE | Admit: 2010-02-27 | Payer: Self-pay | Source: Home / Self Care | Admitting: Oncology

## 2010-04-08 ENCOUNTER — Encounter: Payer: Self-pay | Admitting: Oncology

## 2010-06-03 LAB — DIFFERENTIAL
Eosinophils Absolute: 0 10*3/uL (ref 0.0–0.7)
Lymphs Abs: 0.7 10*3/uL (ref 0.7–4.0)
Monocytes Relative: 6 % (ref 3–12)
Neutrophils Relative %: 84 % — ABNORMAL HIGH (ref 43–77)

## 2010-06-03 LAB — CBC
HCT: 37.4 % — ABNORMAL LOW (ref 39.0–52.0)
HCT: 37.5 % — ABNORMAL LOW (ref 39.0–52.0)
Hemoglobin: 13 g/dL (ref 13.0–17.0)
Hemoglobin: 13 g/dL (ref 13.0–17.0)
Hemoglobin: 13.1 g/dL (ref 13.0–17.0)
MCHC: 34.8 g/dL (ref 30.0–36.0)
MCHC: 34.9 g/dL (ref 30.0–36.0)
RBC: 4.08 MIL/uL — ABNORMAL LOW (ref 4.22–5.81)
RBC: 4.09 MIL/uL — ABNORMAL LOW (ref 4.22–5.81)
RBC: 4.1 MIL/uL — ABNORMAL LOW (ref 4.22–5.81)
RDW: 13 % (ref 11.5–15.5)
WBC: 7.7 10*3/uL (ref 4.0–10.5)
WBC: 8.5 10*3/uL (ref 4.0–10.5)
WBC: 9 10*3/uL (ref 4.0–10.5)

## 2010-06-03 LAB — FIBRINOGEN: Fibrinogen: 415 mg/dL (ref 204–475)

## 2010-06-03 LAB — POCT I-STAT 3, VENOUS BLOOD GAS (G3P V)
pCO2, Ven: 39.3 mmHg — ABNORMAL LOW (ref 45.0–50.0)
pH, Ven: 7.38 — ABNORMAL HIGH (ref 7.250–7.300)

## 2010-06-03 LAB — COMPREHENSIVE METABOLIC PANEL
ALT: 21 U/L (ref 0–53)
Calcium: 9.3 mg/dL (ref 8.4–10.5)
GFR calc Af Amer: >60 mL/min (ref >60)
Glucose, Bld: 89 mg/dL (ref 70–99)
Sodium: 134 mEq/L — ABNORMAL LOW (ref 135–145)
Total Protein: 7.3 g/dL (ref 6.0–8.3)

## 2010-06-03 LAB — POCT I-STAT 3, ART BLOOD GAS (G3+): O2 Saturation: 91 %

## 2010-06-03 LAB — PROTIME-INR: INR: 1.14 (ref 0.00–1.49)

## 2010-06-03 LAB — HEPARIN LEVEL (UNFRACTIONATED)
Heparin Unfractionated: 0.1 IU/mL — ABNORMAL LOW (ref 0.30–0.70)
Heparin Unfractionated: <0.1 IU/mL — ABNORMAL LOW (ref 0.30–0.70)

## 2010-06-03 LAB — APTT: aPTT: 34 seconds (ref 24–37)

## 2010-06-03 LAB — CREATININE, SERUM
Creatinine, Ser: 1.09 mg/dL (ref 0.4–1.5)
GFR calc non Af Amer: >60 mL/min (ref >60)

## 2010-08-03 NOTE — Op Note (Signed)
NAME:  Cameron Rivas, Cameron Rivas                 ACCOUNT NO.:  [id number]   MEDICAL RECORD NO.:  [id number]          PATIENT TYPE:  AMB   LOCATION:  DAY                          FACILITY:  [facility]   PHYSICIAN:  Lindaann Slough, M.D.  DATE OF BIRTH:  1970/11/12   DATE OF PROCEDURE:  11/29/2005  DATE OF DISCHARGE:                                 OPERATIVE REPORT   PREOPERATIVE DIAGNOSIS:  Left testicular mass.   POSTOPERATIVE DIAGNOSIS:  Left testicular seminoma.   PROCEDURE:  Left radical orchiectomy.   SURGEON:  Danae Chen, M.D.   ASSISTANT:  Cornelious Bryant, M.D.   ANESTHESIA:  General.   SPECIMEN:  Left testicle.   BLOOD LOSS:  Minimal.   COMPLICATIONS:  None   INDICATIONS:  This is a 40 year old gentleman who was seen and evaluated by  Dr. Brunilda Rivas after he felt a left testicular mass.  The patient,otherwise, is  asymptomatic.  On ultrasound, there was a left testicular mass consistent  with tumor almost replacing his entire left testicle.  CT scan negative.  His markers were negative.  The patient was then counseled for a left  radical orchiectomy which he consented for.   DESCRIPTION OF PROCEDURE:  The patient was brought to the operating room.  A  time-out was taken to properly identify the patient and procedure going to  be done.  Preoperative antibiotics were given.  General anesthesia was  induced.  The patient was placed in the supine position.  His lower  abdominal, perineal and scrotal area were all prepped and draped in the  normal sterile fashion.  A 7 cm incision was then made in the inguinal  region midway between the anterior superior spine and the symphysis pubis on  the left side.  Dissection was then carried down through subcutaneous  tissues and Scarpa's fascia to the level of the external oblique  aponeurosis.  Attention was given to coagulate any bleeding vessels.  The  external oblique aponeurosis was then incised along the direction of the  fibers.  Using a Kitner  and blunt dissection, the cord was located and was  bluntly teased off the surrounding tissue until a Penrose drain could easily  be put around it.  The cord was then dissected off the surrounding tissue  distally.  The cord was also freed up proximally to the level of the  internal inguinal ring.  A rubber shod was then applied to the cord at the  level of the internal ring.  The testicle was then delivered into the field  from the scrotum.  Upon delivery of the testicle, the gubernaculum was  ligated with 0 Vicryl and cut.  The testicle was then put on a set of towels  isolating it from the rest of the field.  The testicle felt hard and there  were obviously visible areas of a solid mass not consistent with the regular  testicular consistency.  A wedge section was taken through that mass was  sent for frozen section.  The defect was then closed with 2-0 Vicryl in a  running fashion.  The  frozen section came back as consistent with seminoma.  At this point, the testicle was wrapped in sterile towels and kept in  isolation from the rest of the field.  The cord was then doubly ligated at  the level of the internal ring just proximal to the rubber shod and was cut.  2-0 silk was then applied to the cut end of the spermatic cord with a long  tag.  This was done in order for potential future removal of the rest of the  cord.  The testicle was sent for permanent pathological section.  The field  was then copiously irrigated.  Bleeders were controlled by electrocautery.  The external oblique aponeurosis was then closed using 2-0 Vicryl in a  running fashion.  Scarpa's fascia was loosely approximated with 2-0 Vicryl.  The skin was then closed using 4-0 Monocryl in a subcuticular running  fashion.  The wound was then dressed appropriately.  The patient was  awakened from anesthesia and extubated in the operating room with no  complications.  He was taken in stable condition to PACU.  Please note  that  Dr. Brunilda Rivas was present and participated in the entire procedure as he was  responsible surgeon.     ______________________________  Cornelious Bryant, MD      Lindaann Slough, M.D.  Electronically Signed    SK/MEDQ  D:  11/29/2005  T:  11/30/2005  Job:  [id number]

## 2010-12-14 LAB — CBC
MCV: 90.8
Platelets: 171
RBC: 4.7
WBC: 5

## 2016-09-24 ENCOUNTER — Ambulatory Visit (HOSPITAL_COMMUNITY)
Admission: RE | Admit: 2016-09-24 | Discharge: 2016-09-24 | Disposition: A | Discharge status: Home / Self Care | Payer: BC Managed Care – PPO | Source: Ambulatory Visit | Attending: Urology | Admitting: Urology

## 2016-09-24 ENCOUNTER — Other Ambulatory Visit: Payer: Self-pay | Admitting: Urology

## 2016-09-24 DIAGNOSIS — C6210 Malignant neoplasm of unspecified descended testis: Secondary | ICD-10-CM | POA: Insufficient documentation

## 2017-04-08 ENCOUNTER — Other Ambulatory Visit (INDEPENDENT_AMBULATORY_CARE_PROVIDER_SITE_OTHER): Payer: Self-pay

## 2017-04-08 ENCOUNTER — Ambulatory Visit (INDEPENDENT_AMBULATORY_CARE_PROVIDER_SITE_OTHER): Payer: BC Managed Care – PPO | Admitting: Orthopaedic Surgery

## 2017-04-08 ENCOUNTER — Ambulatory Visit (INDEPENDENT_AMBULATORY_CARE_PROVIDER_SITE_OTHER): Payer: Self-pay

## 2017-04-08 ENCOUNTER — Ambulatory Visit
Admission: RE | Admit: 2017-04-08 | Discharge: 2017-04-08 | Disposition: A | Discharge status: Home / Self Care | Payer: BC Managed Care – PPO | Source: Ambulatory Visit | Attending: Orthopaedic Surgery | Admitting: Orthopaedic Surgery

## 2017-04-08 DIAGNOSIS — M25562 Pain in left knee: Secondary | ICD-10-CM

## 2017-04-08 DIAGNOSIS — S82122A Displaced fracture of lateral condyle of left tibia, initial encounter for closed fracture: Secondary | ICD-10-CM | POA: Diagnosis not present

## 2017-04-08 NOTE — Progress Notes (Signed)
Office Visit Note   Patient: Cameron Rivas           Date of Birth: [birth year]           MRN: [id number] Visit Date: 04/08/2017              Requested by: Maury Dus, MD [place] [place], [place] [address] PCP: Maury Dus, MD   Assessment & Plan: Visit Diagnoses:  1. Closed fracture of lateral portion of left tibial plateau, initial encounter     Plan: Impression is acute lateral tibial plateau fracture on x-ray.   he was able to have a CT scan later in the day which I reviewed which showed a minimally depressed lateral tibial plateau fracture of approximately 2-3 mm.  I called the patient left a voicemail asking him to follow-up later this weeks without discussed the findings and the treatment options.  For now I want him to be nonweightbearing with crutches.  Follow-Up Instructions: Return in about 2 weeks (around 04/22/2017).   Orders:  Orders Placed This Encounter  Procedures  . XR Knee 1-2 Views Left   No orders of the defined types were placed in this encounter.     Procedures: No procedures performed   Clinical Data: No additional findings.   Subjective: Chief Complaint  Patient presents with  . Left Knee - Pain    Patient is a very 47 year old gentleman who slipped in mud 2 days ago and felt acute pain and swelling in his left knee.  His leg buckled in a valgus fashion.  He has swelling in his knee and has pain with ambulation.  He is using a crutch.    Review of Systems  Constitutional: Negative.   All other systems reviewed and are negative.    Objective: Vital Signs: There were no vitals taken for this visit.  Physical Exam  Constitutional: He is oriented to person, place, and time. He appears well-developed and well-nourished.  HENT:  Head: Normocephalic and atraumatic.  Eyes: Pupils are equal, round, and reactive to light.  Neck: Neck supple.  Pulmonary/Chest: Effort normal.  Abdominal: Soft.  Musculoskeletal: Normal  range of motion.  Neurological: He is alert and oriented to person, place, and time.  Skin: Skin is warm.  Psychiatric: He has a normal mood and affect. His behavior is normal. Judgment and thought content normal.  Nursing note and vitals reviewed.   Ortho Exam Left knee exam shows a moderate joint effusion.  He has tenderness over the lateral tibial plateau.  Collaterals are grossly intact. Specialty Comments:  No specialty comments available.  Imaging: Ct Knee Left Wo Contrast  Result Date: 04/08/2017 CLINICAL DATA:  Fall 04/06/2017. Left knee pain and swelling. Question tibial plateau fracture. EXAM: CT OF THE LEFT KNEE WITHOUT CONTRAST TECHNIQUE: Multidetector CT imaging of the left knee was performed according to the standard protocol. Multiplanar CT image reconstructions were also generated. COMPARISON:  Knee radiographs 04/08/2017. FINDINGS: Bones/Joint/Cartilage There is an acute die punch fracture of the lateral tibial plateau centrally. The depressed fragment measures approximately 3.0 x 1.8 cm on image 34. There is up to 3 mm of depression of the articular surface on coronal image number 26. The tibial spines are intact. There is no involvement of the medial tibial plateau. The distal femur, proximal fibula and patella are intact. There is a moderate size lipohemarthrosis. Mild underlying degenerative changes are present in the medial compartment. Ligaments Suboptimally assessed by CT.  The cruciate  ligaments appear intact. Muscles and Tendons The extensor mechanism is intact. No muscular abnormalities are seen. Soft tissues Mild prepatellar subcutaneous edema. IMPRESSION: 1. Acute fracture of the lateral tibial plateau with mild depression of a central die punch fracture fragment. 2. Associated lipohemarthrosis. 3. The distal femur and proximal fibula appear intact. Electronically Signed   By: Richardean Sale M.D.   On: 04/08/2017 17:16   Xr Knee 1-2 Views Left  Result Date:  04/08/2017 Acute mildly depressed lateral tibial plateau fracture    PMFS History: There are no active problems to display for this patient.  No past medical history on file.  No family history on file.   Social History   Occupational History  . Not on file  Tobacco Use  . Smoking status: Not on file  Substance and Sexual Activity  . Alcohol use: Not on file  . Drug use: Not on file  . Sexual activity: Not on file

## 2017-04-08 NOTE — Progress Notes (Signed)
Please have him come in on Friday to discuss CT scan results.

## 2017-04-11 ENCOUNTER — Encounter (INDEPENDENT_AMBULATORY_CARE_PROVIDER_SITE_OTHER): Payer: Self-pay | Admitting: Orthopaedic Surgery

## 2017-04-11 ENCOUNTER — Ambulatory Visit (INDEPENDENT_AMBULATORY_CARE_PROVIDER_SITE_OTHER): Payer: BC Managed Care – PPO | Admitting: Orthopaedic Surgery

## 2017-04-11 DIAGNOSIS — S82122A Displaced fracture of lateral condyle of left tibia, initial encounter for closed fracture: Secondary | ICD-10-CM | POA: Diagnosis not present

## 2017-04-11 NOTE — Progress Notes (Signed)
   Office Visit Note   Patient: Cameron Rivas           Date of Birth: 07-Dec-1970           MRN: [id number] Visit Date: 04/11/2017              Requested by: Maury Dus, MD [address] [place], [place] [address] PCP: Maury Dus, MD   Assessment & Plan: Visit Diagnoses:  1. Closed fracture of lateral portion of left tibial plateau, initial encounter     Plan: Impression is minimally depressed lateral tibial plateau fracture on CT scan.  This was reviewed with the patient and his wife.  I think the displaced fragment is small enough that I think would do fine with nonoperative treatment as long as he is compliant with nonweightbearing.  His leg alignment is anatomic.  Prescription for a knee scooter and Bledsoe brace.  He understands he needs to be nonweightbearing for 6 weeks.  Follow-up in 2 weeks for repeat 2 view x-rays of the left knee.  Patient already has some early evidence of arthritis.  We also discussed the pros and cons of surgical treatment and together we agreed to pursue nonoperative treatment.  Follow-Up Instructions: Return in about 2 weeks (around 04/25/2017).   Orders:  No orders of the defined types were placed in this encounter.  No orders of the defined types were placed in this encounter.     Procedures: No procedures performed   Clinical Data: No additional findings.   Subjective: Chief Complaint  Patient presents with  . Left Knee - Pain, Follow-up    Patient comes in today to review his CT scan.  He admits to be noncompliant with nonweightbearing.    Review of Systems   Objective: Vital Signs: There were no vitals taken for this visit.  Physical Exam  Ortho Exam Left knee exam shows normal clinical alignment.  There is no instability to varus valgus. Specialty Comments:  No specialty comments available.  Imaging: No results found.   PMFS History: There are no active problems to display for this  patient.  History reviewed. No pertinent past medical history.  History reviewed. No pertinent family history.  History reviewed. No pertinent surgical history. Social History   Occupational History  . Not on file  Tobacco Use  . Smoking status: Never Smoker  . Smokeless tobacco: Never Used  Substance and Sexual Activity  . Alcohol use: Not on file  . Drug use: Not on file  . Sexual activity: Not on file

## 2017-04-22 ENCOUNTER — Ambulatory Visit (INDEPENDENT_AMBULATORY_CARE_PROVIDER_SITE_OTHER): Payer: BC Managed Care – PPO | Admitting: Orthopaedic Surgery

## 2017-04-22 ENCOUNTER — Encounter (INDEPENDENT_AMBULATORY_CARE_PROVIDER_SITE_OTHER): Payer: Self-pay | Admitting: Orthopaedic Surgery

## 2017-04-22 ENCOUNTER — Ambulatory Visit (INDEPENDENT_AMBULATORY_CARE_PROVIDER_SITE_OTHER): Payer: BC Managed Care – PPO

## 2017-04-22 DIAGNOSIS — M25562 Pain in left knee: Secondary | ICD-10-CM | POA: Diagnosis not present

## 2017-04-22 DIAGNOSIS — S82142A Displaced bicondylar fracture of left tibia, initial encounter for closed fracture: Secondary | ICD-10-CM | POA: Insufficient documentation

## 2017-04-22 DIAGNOSIS — S82142D Displaced bicondylar fracture of left tibia, subsequent encounter for closed fracture with routine healing: Secondary | ICD-10-CM

## 2017-04-22 NOTE — Progress Notes (Signed)
      Patient: Cameron Rivas           Date of Birth: 07/02/1970           MRN: [id number] Visit Date: 04/22/2017 PCP: Maury Dus, MD   Assessment & Plan:  Chief Complaint:  Chief Complaint  Patient presents with  . Left Knee - Follow-up   Visit Diagnoses:  1. Closed fracture of left tibial plateau with routine healing, subsequent encounter   2. Left knee pain, unspecified chronicity     Plan: Comes in for follow-up.  2 weeks out lateral tibial plateau fracture date of injury 04/08/2017.  He has been wearing his Bledsoe but has been noncompliant with nonweightbearing.  Minimal pain unless the puts pressure on the left knee and pivots.  Examination reveals a trace effusion.  Range of motion 0-95 degrees minimal tenderness lateral  joint line.  He is neurovascular intact distally.  At this point, we would like for Cameron Rivas to remain in his Bledsoe brace and nonweightbearing for the next 4 weeks.  He will follow-up with [place] in 2 weeks time for repeat 2 view x-ray of his left knee.  He will call with questions or concerns in the meantime.  Follow-Up Instructions: Return in about 2 weeks (around 05/06/2017).   Orders:  Orders Placed This Encounter  Procedures  . XR Knee 1-2 Views Left   No orders of the defined types were placed in this encounter.   Imaging: Xr Knee 1-2 Views Left  Result Date: 04/22/2017 X-rays of the left knee reveal no further depression of the tibial plateau fracture.  There does appear to be 2-3 mm depression.   PMFS History: Patient Active Problem List   Diagnosis Date Noted  . Closed fracture of left tibial plateau 04/22/2017   History reviewed. No pertinent past medical history.  History reviewed. No pertinent family history.  History reviewed. No pertinent surgical history. Social History   Occupational History  . Not on file  Tobacco Use  . Smoking status: Never Smoker  . Smokeless tobacco: Never Used  Substance and Sexual Activity  . Alcohol  use: Not on file  . Drug use: Not on file  . Sexual activity: Not on file

## 2017-04-25 ENCOUNTER — Ambulatory Visit (INDEPENDENT_AMBULATORY_CARE_PROVIDER_SITE_OTHER): Payer: BC Managed Care – PPO | Admitting: Orthopaedic Surgery

## 2017-05-08 ENCOUNTER — Ambulatory Visit (INDEPENDENT_AMBULATORY_CARE_PROVIDER_SITE_OTHER): Payer: BC Managed Care – PPO

## 2017-05-08 ENCOUNTER — Ambulatory Visit (INDEPENDENT_AMBULATORY_CARE_PROVIDER_SITE_OTHER): Payer: BC Managed Care – PPO | Admitting: Orthopaedic Surgery

## 2017-05-08 ENCOUNTER — Encounter (INDEPENDENT_AMBULATORY_CARE_PROVIDER_SITE_OTHER): Payer: Self-pay | Admitting: Orthopaedic Surgery

## 2017-05-08 DIAGNOSIS — G8929 Other chronic pain: Secondary | ICD-10-CM

## 2017-05-08 DIAGNOSIS — M25562 Pain in left knee: Secondary | ICD-10-CM

## 2017-05-08 DIAGNOSIS — S82142D Displaced bicondylar fracture of left tibia, subsequent encounter for closed fracture with routine healing: Secondary | ICD-10-CM

## 2017-05-08 NOTE — Progress Notes (Signed)
Patient is 5 weeks status post minimally depressed lateral tibial plateau fracture.  He is essentially been doing whatever he wants.  He denies any pain.  He is not.  He is ready to return back to work.  He says he has been doing a lot of outdoor work and shoveling leave of the remainder.  He says that he has been doing fine.  He has no pain with exam.  Collaterals and cruciates are stable.  X-rays show stable appearance of the fracture without any interval worsening.  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  Work note was filled out today for him.  Follow-up with me as needed.

## 2022-02-01 ENCOUNTER — Other Ambulatory Visit: Payer: Self-pay | Admitting: Internal Medicine

## 2022-02-01 DIAGNOSIS — I10 Essential (primary) hypertension: Secondary | ICD-10-CM

## 2022-03-14 ENCOUNTER — Ambulatory Visit
Admission: RE | Admit: 2022-03-14 | Discharge: 2022-03-14 | Disposition: A | Discharge status: Home / Self Care | Payer: No Typology Code available for payment source | Source: Ambulatory Visit | Attending: Internal Medicine | Admitting: Internal Medicine

## 2022-03-14 DIAGNOSIS — I10 Essential (primary) hypertension: Secondary | ICD-10-CM

## 2022-04-03 ENCOUNTER — Encounter: Payer: Self-pay | Admitting: *Deleted

## 2022-04-03 NOTE — Telephone Encounter (Signed)
Error

## 2022-04-04 ENCOUNTER — Encounter: Payer: Self-pay | Admitting: Neurology

## 2022-04-04 ENCOUNTER — Ambulatory Visit (INDEPENDENT_AMBULATORY_CARE_PROVIDER_SITE_OTHER): Payer: No Typology Code available for payment source | Admitting: Neurology

## 2022-04-04 VITALS — BP 145/88 | HR 93 | Ht 73.0 in | Wt 295.4 lb

## 2022-04-04 DIAGNOSIS — G4719 Other hypersomnia: Secondary | ICD-10-CM

## 2022-04-04 DIAGNOSIS — G4730 Sleep apnea, unspecified: Secondary | ICD-10-CM | POA: Diagnosis not present

## 2022-04-04 DIAGNOSIS — R0681 Apnea, not elsewhere classified: Secondary | ICD-10-CM | POA: Diagnosis not present

## 2022-04-04 DIAGNOSIS — E669 Obesity, unspecified: Secondary | ICD-10-CM

## 2022-04-04 DIAGNOSIS — R0683 Snoring: Secondary | ICD-10-CM | POA: Diagnosis not present

## 2022-04-04 DIAGNOSIS — G4700 Insomnia, unspecified: Secondary | ICD-10-CM

## 2022-04-04 DIAGNOSIS — R0689 Other abnormalities of breathing: Secondary | ICD-10-CM

## 2022-04-04 NOTE — Patient Instructions (Signed)

## 2022-04-04 NOTE — Progress Notes (Signed)
Subjective:    Patient ID: Cameron Rivas is a 52 y.o. male.  HPI    Star Age, MD, PhD [facility] [address] [address] [place], [place] [address]  Dear Dr. Virgina Jock,  I saw your patient, Cameron Rivas, upon your kind request in my sleep clinic today for initial consultation of his sleep disorder, in particular, concern for underlying obstructive sleep apnea.  The patient is unaccompanied today.  As you know, Cameron Rivas is a 52 year old male with an underlying medical history of hypertension, testicular cancer, status post surgery in 2004, status post chemotherapy, history of syncope, and obesity, who reports snoring and excessive daytime somnolence and difficulty losing weight, as well as a longer standing Hx of difficulty maintaining sleep (for years).  His Epworth sleepiness score is 9 out of 24, fatigue severity score is 45 out of 63.  He has trouble going to sleep and maintaining sleep and takes ZzQuil nightly.  I reviewed your office note from 01/31/2022. He does consume quite a bit of caffeine in the form of black coffee, about 10 cups/day on average, as well as large glass of unsweetened tea per day.  When he reduced his coffee, he had a headache.  He was started on blood pressure medication recently.  He does not like to take blood pressure medication, he has had trouble losing weight.  He has a stressful job.  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  He lives with his wife, they have 3 great Dane's in the household, they often sleep in the bedroom with them but not on the bed.  They do tend to have a TV on at night and put it on a 1 hour sleep timer typically.  His wife has noticed apneic pauses while he is asleep and he has woken himself up with a sense of gasping for air.  He denies recurrent nocturnal or morning headaches or night to night nocturia,  not aware of any family history of sleep apnea, he believes he had a tonsillectomy as a child and also ear tubes.  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)    His Past Surgical History Is Significant For: Past Surgical History:  Procedure Laterality Date   CARDIAC CATHETERIZATION     2010 or 2011   testicle Left    removed 2004   [place]    His Family History Is Significant For: Family History  Problem Relation Age of Onset   Asthma Mother    Diabetes Mellitus I Mother    Pancreatic cancer Mother    Kidney failure Mother        dialysis   Throat cancer Father    Diabetes Mellitus I Maternal Grandmother    Alzheimer's disease Maternal Grandmother    Heart disease Maternal Grandfather     His Social History Is Significant For: Social History   Socioeconomic History   Marital status: Married    Spouse name: Not on file   Number of children: Not on file   Years of education: Not on file   Highest education level: Not on file  Occupational History   Not on file  Tobacco Use   Smoking status: Never   Smokeless tobacco: Never  Vaping Use   Vaping Use: Never used  Substance and Sexual Activity   Alcohol use: Yes    Comment: 1 beer or 2 shot at night nightly   Drug use: Never   Sexual activity: Not on file  Other Topics Concern   Not on file  Social History Narrative   Caffiene coffee 12 cups daily, 5-6 cups unsweetened tea.    Work Doctor, general practice, Neurosurgeon   Social Determinants of Radio broadcast assistant Strain: Not on file  Food Insecurity: Not on file  Transportation Needs: Not on file  Physical Activity: Not on file  Stress: Not on file  Social Connections: Not on file    His Allergies Are:  Allergies  Allergen Reactions   Iohexol       Desc: Pt began having sob, with sneezing and immed vomiting post contrast (multihance), Onset Date: [id number]   :   His Current Medications Are:  Outpatient Encounter Medications as of 04/04/2022  Medication Sig   EPINEPHrine (EPIPEN 2-PAK) 0.3 mg/0.3 mL IJ SOAJ injection Inject 0.3 mg into the muscle as needed.   losartan (COZAAR) 50 MG tablet Take 50 mg by mouth daily.   Pseudoeph-Doxylamine-DM-APAP (NYQUIL PO) Take by mouth. Takes every night for sleep   No facility-administered encounter medications on file as of 04/04/2022.  :   Review of Systems:  Out of a complete 14 point review of systems, all are reviewed and negative with the exception of these symptoms as listed below:  Review of Systems  Neurological:        Loud Snoring Trouble sleeping.  ESS 9, FSS 45.  Nyquil nightly for sleep.  Drinks a lot for coffee.      Objective:  Neurological Exam  Physical Exam Physical Examination:   Vitals:   04/04/22 1238  BP: (!) 145/88  Pulse: 93    General Examination: The patient is a very pleasant 52 y.o. male in no acute distress. He appears well-developed and well-nourished and well groomed.   HEENT: Normocephalic, atraumatic, pupils are equal, round and reactive to light, extraocular tracking is good without limitation to gaze excursion or nystagmus noted. Hearing is grossly intact. Face is symmetric with normal facial animation. Speech is clear with no dysarthria noted. There is no hypophonia. There is no lip, neck/head, jaw or voice tremor. Neck is supple with full range of passive and active motion. There are no carotid bruits on auscultation. Oropharynx exam reveals: mild mouth dryness, adequate dental hygiene and moderate airway crowding, due to larger uvula, tonsillar tissue noted on each side with possible residual tonsillar tissue, as he recalls he had a tonsillectomy.  Mallampati class III.  Neck circumference 19 inches, minimal overbite.  Tongue protrudes centrally  and palate elevates symmetrically.  Chest: Clear to auscultation without wheezing, rhonchi or crackles noted.  Heart: S1+S2+0, regular and normal without murmurs, rubs or gallops noted.   Abdomen: Soft, non-tender and non-distended.  Extremities: There is no pitting edema in the distal lower extremities bilaterally.   Skin: Warm and dry without trophic changes noted.   Musculoskeletal: exam reveals no obvious joint deformities.   Neurologically:  Mental status: The patient is awake, alert and oriented in all 4 spheres. His immediate and remote memory, attention, language skills and fund of knowledge are appropriate. There is no evidence of aphasia, agnosia, apraxia or anomia.  Speech is clear with normal prosody and enunciation. Thought process is linear. Mood is normal and affect is normal.  Cranial nerves II - XII are as described above under HEENT exam.  Motor exam: Normal bulk, strength and tone is noted. There is no obvious action or resting tremor.  Fine motor skills and coordination: grossly intact.  Cerebellar testing: No dysmetria or intention tremor. There is no truncal or gait ataxia.  Sensory exam: intact to light touch in the upper and lower extremities.  Gait, station and balance: He stands easily. No veering to one side is noted. No leaning to one side is noted. Posture is age-appropriate and stance is narrow based. Gait shows normal stride length and normal pace. No problems turning are noted.   Assessment and Plan:   In summary, Cameron Rivas is a very pleasant 52 y.o.-year old male with an underlying medical history of hypertension, testicular cancer, status post surgery in 2004, status post chemotherapy, history of syncope, and obesity, whose history and physical exam are concerning for sleep disordered breathing, supporting a current working diagnosis of unspecified sleep apnea, with the main differential diagnoses of obstructive sleep apnea (OSA) versus upper airway  resistance syndrome (UARS) versus central sleep apnea (CSA), or mixed sleep apnea. A laboratory attended sleep study is typically considered "gold standard" for evaluation of sleep disordered breathing.   I had a long chat with the patient about my findings and the diagnosis of sleep apnea, particularly OSA, its prognosis and treatment options. We talked about medical/conservative treatments, surgical interventions and non-pharmacological approaches for symptom control. I explained, in particular, the risks and ramifications of untreated moderate to severe OSA, especially with respect to developing cardiovascular disease down the road, including congestive heart failure (CHF), difficult to treat hypertension, cardiac arrhythmias (particularly A-fib), neurovascular complications including TIA, stroke and dementia. Even type 2 diabetes has, in part, been linked to untreated OSA. Symptoms of untreated OSA may include (but may not be limited to) daytime sleepiness, nocturia (i.e. frequent nighttime urination), memory problems, mood irritability and suboptimally controlled or worsening mood disorder such as depression and/or anxiety, lack of energy, lack of motivation, physical discomfort, as well as recurrent headaches, especially morning or nocturnal headaches. We talked about the importance of maintaining a healthy lifestyle and striving for healthy weight. In addition, we talked about the importance of striving for and maintaining good sleep hygiene. I recommended a sleep study at this time. I outlined the differences between a laboratory attended sleep study which is considered more comprehensive and accurate over the option of a home sleep test (HST); the latter may lead to underestimation of sleep disordered breathing in some instances and does not help with diagnosing upper airway resistance syndrome and is not accurate enough to diagnose primary central sleep apnea typically. I outlined possible surgical and  non-surgical treatment options of OSA, including the use of a positive airway pressure (PAP) device (i.e. CPAP, AutoPAP/APAP or BiPAP in certain circumstances), a custom-made dental device (aka oral appliance, which would require a referral to a specialist dentist or orthodontist typically, and is generally speaking not considered for patients with full dentures or edentulous state), upper airway surgical options, such as traditional UPPP (which is not considered a first-line treatment) or the Inspire device (hypoglossal nerve stimulator, which would involve a referral for consultation with an ENT surgeon, after careful selection, following inclusion criteria - also not first-line treatment). I explained the PAP treatment option to the patient in detail, as this is  generally considered first-line treatment.  The patient indicated that he would be willing to try PAP therapy, if the need arises. I explained the importance of being compliant with PAP treatment, not only for insurance purposes but primarily to improve patient's symptoms symptoms, and for the patient's long term health benefit, including to reduce His cardiovascular risks longer-term.    We will pick up our discussion about the next steps and treatment options after testing.  We will keep him posted as to the test results by phone call and/or MyChart messaging where possible.  We will plan to follow-up in sleep clinic accordingly as well.  I answered all his questions today and the patient was in agreement.   I encouraged him to call with any interim questions, concerns, problems or updates or email [place] through [place].  Generally speaking, sleep test authorizations may take up to 2 weeks, sometimes less, sometimes longer, the patient is encouraged to get in touch with [place] if they do not hear back from the sleep lab staff directly within the next 2 weeks.  Thank you very much for allowing me to participate in the care of this nice patient. If I can  be of any further assistance to you please do not hesitate to call me at [phone].  Sincerely,   Star Age, MD, PhD

## 2022-05-06 ENCOUNTER — Telehealth: Payer: Self-pay | Admitting: Neurology

## 2022-05-06 NOTE — Telephone Encounter (Signed)
[facility]- [employer] no auth req ref # [id number] on 04/30/22   Patient is scheduled at [facility] for 06/25/22 at 8 pm.  Mailed packet to the patient.

## 2022-06-18 NOTE — Telephone Encounter (Signed)
Patient left a voicemail on my phone today stating he needs to r/s his SS due to he will be out of town that night.  I called him back but no answer I left him a message to call me back to r/s.  I did cancel his appt.

## 2022-06-19 NOTE — Telephone Encounter (Signed)
Patient left a vmail- I called him back and left another vmail for him to call me back to r/s.

## 2022-09-07 ENCOUNTER — Emergency Department (HOSPITAL_COMMUNITY): Payer: No Typology Code available for payment source

## 2022-09-07 ENCOUNTER — Observation Stay (HOSPITAL_COMMUNITY): Payer: No Typology Code available for payment source

## 2022-09-07 ENCOUNTER — Observation Stay (HOSPITAL_COMMUNITY)
Admission: EM | Admit: 2022-09-07 | Discharge: 2022-09-08 | Disposition: A | Discharge status: Home / Self Care | Payer: No Typology Code available for payment source | Attending: Student | Admitting: Student

## 2022-09-07 ENCOUNTER — Encounter (HOSPITAL_COMMUNITY): Payer: Self-pay

## 2022-09-07 ENCOUNTER — Other Ambulatory Visit: Payer: Self-pay

## 2022-09-07 DIAGNOSIS — E872 Acidosis, unspecified: Secondary | ICD-10-CM | POA: Diagnosis not present

## 2022-09-07 DIAGNOSIS — K760 Fatty (change of) liver, not elsewhere classified: Secondary | ICD-10-CM | POA: Diagnosis not present

## 2022-09-07 DIAGNOSIS — I10 Essential (primary) hypertension: Secondary | ICD-10-CM | POA: Diagnosis not present

## 2022-09-07 DIAGNOSIS — R4182 Altered mental status, unspecified: Secondary | ICD-10-CM | POA: Diagnosis not present

## 2022-09-07 DIAGNOSIS — R42 Dizziness and giddiness: Principal | ICD-10-CM

## 2022-09-07 DIAGNOSIS — E8721 Acute metabolic acidosis: Secondary | ICD-10-CM | POA: Diagnosis not present

## 2022-09-07 DIAGNOSIS — Z79899 Other long term (current) drug therapy: Secondary | ICD-10-CM | POA: Diagnosis not present

## 2022-09-07 DIAGNOSIS — H8120 Vestibular neuronitis, unspecified ear: Secondary | ICD-10-CM

## 2022-09-07 DIAGNOSIS — E860 Dehydration: Secondary | ICD-10-CM | POA: Diagnosis not present

## 2022-09-07 LAB — COMPREHENSIVE METABOLIC PANEL
ALT: 25 U/L (ref 0–44)
AST: 18 U/L (ref 15–41)
Albumin: 3.9 g/dL (ref 3.5–5.0)
Alkaline Phosphatase: 49 U/L (ref 38–126)
Anion gap: 8 (ref 5–15)
BUN: 16 mg/dL (ref 6–20)
CO2: 21 mmol/L — ABNORMAL LOW (ref 22–32)
Calcium: 9.2 mg/dL (ref 8.9–10.3)
Chloride: 108 mmol/L (ref 98–111)
Creatinine, Ser: 0.99 mg/dL (ref 0.61–1.24)
GFR, Estimated: >60 mL/min (ref >60)
Glucose, Bld: 103 mg/dL — ABNORMAL HIGH (ref 70–99)
Potassium: 3.5 mmol/L (ref 3.5–5.1)
Sodium: 137 mmol/L (ref 135–145)
Total Bilirubin: 0.6 mg/dL (ref 0.3–1.2)
Total Protein: 6.6 g/dL (ref 6.5–8.1)

## 2022-09-07 LAB — MAGNESIUM: Magnesium: 2.1 mg/dL (ref 1.7–2.4)

## 2022-09-07 LAB — I-STAT VENOUS BLOOD GAS, ED
Acid-base deficit: 3 mmol/L — ABNORMAL HIGH (ref 0.0–2.0)
Bicarbonate: 21.4 mmol/L (ref 20.0–28.0)
Calcium, Ion: 1.2 mmol/L (ref 1.15–1.40)
HCT: 41 % (ref 39.0–52.0)
Hemoglobin: 13.9 g/dL (ref 13.0–17.0)
O2 Saturation: 77 %
Potassium: 3.5 mmol/L (ref 3.5–5.1)
Sodium: 141 mmol/L (ref 135–145)
TCO2: 22 mmol/L (ref 22–32)
pCO2, Ven: 36.2 mmHg — ABNORMAL LOW (ref 44–60)
pH, Ven: 7.379 (ref 7.25–7.43)
pO2, Ven: 43 mmHg (ref 32–45)

## 2022-09-07 LAB — PROTIME-INR
INR: 1 (ref 0.8–1.2)
INR: 1 (ref 0.8–1.2)
Prothrombin Time: 13.6 seconds (ref 11.4–15.2)
Prothrombin Time: 13.7 seconds (ref 11.4–15.2)

## 2022-09-07 LAB — CBC
HCT: 42.3 % (ref 39.0–52.0)
Hemoglobin: 14.5 g/dL (ref 13.0–17.0)
MCH: 31.5 pg (ref 26.0–34.0)
MCHC: 34.3 g/dL (ref 30.0–36.0)
MCV: 92 fL (ref 80.0–100.0)
Platelets: 174 10*3/uL (ref 150–400)
RBC: 4.6 MIL/uL (ref 4.22–5.81)
RDW: 12.7 % (ref 11.5–15.5)
WBC: 7.8 10*3/uL (ref 4.0–10.5)
nRBC: 0 % (ref 0.0–0.2)

## 2022-09-07 LAB — CBG MONITORING, ED: Glucose-Capillary: 154 mg/dL — ABNORMAL HIGH (ref 70–99)

## 2022-09-07 LAB — LIPASE, BLOOD: Lipase: 45 U/L (ref 11–51)

## 2022-09-07 LAB — APTT: aPTT: 24 seconds (ref 24–36)

## 2022-09-07 LAB — HEPATIC FUNCTION PANEL
ALT: 23 U/L (ref 0–44)
AST: 19 U/L (ref 15–41)
Albumin: 3.8 g/dL (ref 3.5–5.0)
Alkaline Phosphatase: 49 U/L (ref 38–126)
Bilirubin, Direct: 0.1 mg/dL (ref 0.0–0.2)
Indirect Bilirubin: 0.5 mg/dL (ref 0.3–0.9)
Total Bilirubin: 0.6 mg/dL (ref 0.3–1.2)
Total Protein: 6 g/dL — ABNORMAL LOW (ref 6.5–8.1)

## 2022-09-07 LAB — URINALYSIS, ROUTINE W REFLEX MICROSCOPIC
Bilirubin Urine: NEGATIVE
Glucose, UA: NEGATIVE mg/dL
Hgb urine dipstick: NEGATIVE
Ketones, ur: NEGATIVE mg/dL
Leukocytes,Ua: NEGATIVE
Nitrite: NEGATIVE
Protein, ur: NEGATIVE mg/dL
Specific Gravity, Urine: 1.023 (ref 1.005–1.030)
pH: 6 (ref 5.0–8.0)

## 2022-09-07 LAB — CK: Total CK: 96 U/L (ref 49–397)

## 2022-09-07 LAB — PHOSPHORUS: Phosphorus: 2.4 mg/dL — ABNORMAL LOW (ref 2.5–4.6)

## 2022-09-07 LAB — BASIC METABOLIC PANEL
Anion gap: 10 (ref 5–15)
BUN: 20 mg/dL (ref 6–20)
CO2: 19 mmol/L — ABNORMAL LOW (ref 22–32)
Calcium: 9.2 mg/dL (ref 8.9–10.3)
Chloride: 110 mmol/L (ref 98–111)
Creatinine, Ser: 1.1 mg/dL (ref 0.61–1.24)
GFR, Estimated: >60 mL/min (ref >60)
Glucose, Bld: 150 mg/dL — ABNORMAL HIGH (ref 70–99)
Potassium: 3.8 mmol/L (ref 3.5–5.1)
Sodium: 139 mmol/L (ref 135–145)

## 2022-09-07 LAB — LACTIC ACID, PLASMA
Lactic Acid, Venous: 2 mmol/L (ref 0.5–1.9)
Lactic Acid, Venous: 1.8 mmol/L (ref 0.5–1.9)

## 2022-09-07 LAB — ETHANOL: Alcohol, Ethyl (B): <10 mg/dL (ref <10)

## 2022-09-07 LAB — SALICYLATE LEVEL: Salicylate Lvl: <7 mg/dL — ABNORMAL LOW (ref 7.0–30.0)

## 2022-09-07 LAB — TROPONIN I (HIGH SENSITIVITY)
Troponin I (High Sensitivity): 4 ng/L (ref <18)
Troponin I (High Sensitivity): 6 ng/L (ref <18)

## 2022-09-07 LAB — TSH: TSH: 0.54 u[IU]/mL (ref 0.350–4.500)

## 2022-09-07 MED ORDER — MECLIZINE HCL 25 MG PO TABS: 25.0000 mg | ORAL_TABLET | Freq: Three times a day (TID) | ORAL | 0 refills | Status: DC | PRN

## 2022-09-07 MED ORDER — ONDANSETRON HCL 4 MG PO TABS: 4.0000 mg | ORAL_TABLET | Freq: Four times a day (QID) | ORAL | Status: DC | PRN

## 2022-09-07 MED ORDER — ACETAMINOPHEN 325 MG PO TABS: 650.0000 mg | ORAL_TABLET | Freq: Four times a day (QID) | ORAL | Status: DC | PRN

## 2022-09-07 MED ORDER — MECLIZINE HCL 25 MG PO TABS: 25.0000 mg | ORAL_TABLET | Freq: Three times a day (TID) | ORAL | 0 refills | Status: AC | PRN

## 2022-09-07 MED ORDER — DEXAMETHASONE SODIUM PHOSPHATE 4 MG/ML IJ SOLN
4.0000 mg | INTRAMUSCULAR | Status: DC
  Administered 2022-09-08: 4 mg via INTRAVENOUS
  Filled 2022-09-07: qty 1

## 2022-09-07 MED ORDER — DEXAMETHASONE SODIUM PHOSPHATE 4 MG/ML IJ SOLN: 4.0000 mg | INTRAMUSCULAR | Status: DC

## 2022-09-07 MED ORDER — GADOBUTROL 1 MMOL/ML IV SOLN
10.0000 mL | Freq: Once | INTRAVENOUS | Status: AC | PRN
  Administered 2022-09-07: 10 mL via INTRAVENOUS

## 2022-09-07 MED ORDER — MECLIZINE HCL 25 MG PO TABS: 25.0000 mg | ORAL_TABLET | Freq: Three times a day (TID) | ORAL | Status: DC | PRN

## 2022-09-07 MED ORDER — IOHEXOL 350 MG/ML SOLN
75.0000 mL | Freq: Once | INTRAVENOUS | Status: AC | PRN
  Administered 2022-09-07: 75 mL via INTRAVENOUS

## 2022-09-07 MED ORDER — MECLIZINE HCL 25 MG PO TABS
25.0000 mg | ORAL_TABLET | Freq: Once | ORAL | Status: AC
  Administered 2022-09-07: 25 mg via ORAL
  Filled 2022-09-07: qty 1

## 2022-09-07 MED ORDER — SODIUM CHLORIDE 0.9 % IV BOLUS
500.0000 mL | Freq: Once | INTRAVENOUS | Status: AC
  Administered 2022-09-07: 500 mL via INTRAVENOUS

## 2022-09-07 MED ORDER — MECLIZINE HCL 25 MG PO TABS: 25.0000 mg | ORAL_TABLET | Freq: Once | ORAL | Status: DC

## 2022-09-07 MED ORDER — DIAZEPAM 5 MG PO TABS
5.0000 mg | ORAL_TABLET | Freq: Once | ORAL | Status: AC
  Administered 2022-09-07: 5 mg via ORAL
  Filled 2022-09-07: qty 1

## 2022-09-07 MED ORDER — ONDANSETRON HCL 4 MG/2ML IJ SOLN: 4.0000 mg | Freq: Four times a day (QID) | INTRAMUSCULAR | Status: DC | PRN

## 2022-09-07 MED ORDER — SODIUM CHLORIDE 0.9 % IV SOLN: INTRAVENOUS | Status: AC

## 2022-09-07 MED ORDER — HYDROCODONE-ACETAMINOPHEN 5-325 MG PO TABS: 1.0000 | ORAL_TABLET | ORAL | Status: DC | PRN

## 2022-09-07 MED ORDER — ACETAMINOPHEN 650 MG RE SUPP: 650.0000 mg | Freq: Four times a day (QID) | RECTAL | Status: DC | PRN

## 2022-09-07 MED ORDER — DIPHENHYDRAMINE HCL 50 MG/ML IJ SOLN
50.0000 mg | Freq: Four times a day (QID) | INTRAMUSCULAR | Status: DC | PRN
  Administered 2022-09-07: 50 mg via INTRAVENOUS
  Filled 2022-09-07: qty 1

## 2022-09-07 MED ORDER — DIPHENHYDRAMINE HCL 25 MG PO CAPS: 50.0000 mg | ORAL_CAPSULE | Freq: Four times a day (QID) | ORAL | Status: DC | PRN

## 2022-09-07 MED ORDER — DEXAMETHASONE SODIUM PHOSPHATE 10 MG/ML IJ SOLN: 8.0000 mg | Freq: Once | INTRAMUSCULAR | Status: DC

## 2022-09-07 MED ORDER — ONDANSETRON HCL 4 MG/2ML IJ SOLN
4.0000 mg | Freq: Once | INTRAMUSCULAR | Status: AC
  Administered 2022-09-07: 4 mg via INTRAVENOUS
  Filled 2022-09-07: qty 2

## 2022-09-07 NOTE — Discharge Instructions (Addendum)
It was a pleasure caring for you today. Blood lab and urinalysis was reassuring today. CT imaging not showing any disease that would cause your symptoms. I am discharging you with [facility] and would like you to follow up with ENT who is listed in this discharge paperwork.  Seek emergency care if experiencing any new or worsening symptoms.

## 2022-09-07 NOTE — H&P (Signed)
Cameron Rivas ZOX:[id number] DOB: [birth year] DOA: 09/07/2022     PCP: Creola Corn, MD   Outpatient Specialists:   CARDS:   Dr. Katrinka Blazing [facility]  Dr. Frances Furbish    Patient arrived to ER on 09/07/22 at 0617 Referred by Attending Linwood Dibbles, MD   Patient coming from:    home Lives  With family    Chief Complaint:   Chief Complaint  Patient presents with   Dizziness    HPI: Cameron Rivas is a 52 y.o. male with medical history significant of  testicular CA in 2010  sp chemo/radiation, sleep apnea obesity, hypertension history of syncope    Presented with   vertigo Patient presents with vertigo new onset, generalized weakness nausea vomiting This started around 4:30 AM when he woke up to use the bathroom and noted that the room was spinning.  He is not able to ambulate since. No associated fevers no chest pain or shortness of breath no diarrhea no recent illness   Family is concerned the patient may fall down he is unable to ambulate Has stairs at home.  Family thinks that he may not be able to navigate his house Reports significant sweating   Currently on qsymia 7.5 - 46 mg  Reports slight right ear pain that just started    Denies significant ETOH intake  only a few drinks a week   Does not smoke   No results found for: "SARSCOV2NAA"      Regarding pertinent Chronic problems:       HTN on Cozaar       obesity-   BMI Readings from Last 1 Encounters:  09/07/22 37.60 kg/m      OSA -on mouth piece       While in ER:   Difficult to control vertigo patient received a dose of Valium with minimal response continues to be significantly vertiginous. Family requesting observation as patient will be difficult to manage at home Labs showed evidence of metabolic acidosis will need further workup    Lab Orders         Basic metabolic panel         CBC         Urinalysis, Routine w reflex microscopic -Urine, Clean Catch         Hepatic function panel         Lipase,  blood         APTT         Protime-INR         CBG monitoring, ED      CT HEAD   NON acute   MRI brain nonacute CXR -atelectasis    CTA chest -  non-acute, no PE,  no evidence of infiltrate Atelectasis is noted as well as hepatic steatosis  Following Medications were ordered in ER: Medications  diphenhydrAMINE (BENADRYL) capsule 50 mg ( Oral See Alternative 09/07/22 0959)    Or  diphenhydrAMINE (BENADRYL) injection 50 mg (50 mg Intravenous Given 09/07/22 0959)  sodium chloride 0.9 % bolus 500 mL (0 mLs Intravenous Stopped 09/07/22 0849)  ondansetron (ZOFRAN) injection 4 mg (4 mg Intravenous Given 09/07/22 0644)  iohexol (OMNIPAQUE) 350 MG/ML injection 75 mL (75 mLs Intravenous Contrast Given 09/07/22 1118)  meclizine (ANTIVERT) tablet 25 mg (25 mg Oral Given 09/07/22 1335)  meclizine (ANTIVERT) tablet 25 mg (25 mg Oral Given 09/07/22 1520)  diazepam (VALIUM) tablet 5 mg (5 mg Oral Given 09/07/22 1645)    ED Triage Vitals  Enc Vitals Group     BP 09/07/22 0623 123/73     Pulse Rate 09/07/22 0623 72     Resp 09/07/22 0623 18     Temp 09/07/22 0623 (!) 97.5 F (36.4 C)     Temp Source 09/07/22 0623 Oral     SpO2 09/07/22 0623 93 %     Weight 09/07/22 0620 285 lb (129.3 kg)     Height 09/07/22 0620 6\' 1"  (1.854 m)     Head Circumference --      Peak Flow --      Pain Score 09/07/22 0620 0     Pain Loc --      Pain Edu? --      Excl. in GC? --   TMAX(24)@     _________________________________________ Significant initial  Findings: Abnormal Labs Reviewed  BASIC METABOLIC PANEL - Abnormal; Notable for the following components:      Result Value   CO2 19 (*)    Glucose, Bld 150 (*)    All other components within normal limits  HEPATIC FUNCTION PANEL - Abnormal; Notable for the following components:   Total Protein 6.0 (*)    All other components within normal limits  CBG MONITORING, ED - Abnormal; Notable for the following components:   Glucose-Capillary 154 (*)    All other  components within normal limits    _________________________ Troponin   Cardiac Panel (last 3 results) Recent Labs    09/07/22 0725 09/07/22 0921  TROPONINIHS 4 6    ECG: Ordered Personally reviewed and interpreted by me showing: HR : 66 Rhythm: Sinus rhythm Prolonged PR interval Abnormal R-wave progression, early transition QTC 424      The recent clinical data is shown below. Vitals:   09/07/22 1445 09/07/22 1500 09/07/22 1515 09/07/22 1820  BP: 130/76 127/79 123/74 136/85  Pulse: 74 78 67 75  Resp: 15 17 12 15   Temp:    98 F (36.7 C)  TempSrc:    Oral  SpO2: 95% 95% 92% 97%  Weight:      Height:        WBC     Component Value Date/Time   WBC 7.8 09/07/2022 0624   LYMPHSABS 0.7 (L) 10/12/2009 1128   MONOABS 0.3 10/12/2009 1128   EOSABS 0.1 10/12/2009 1128   BASOSABS 0.0 10/12/2009 1128     Lactic Acid, Venous No results found for: "LATICACIDVEN"   Lactic Acid, Venous No results found for: "LATICACIDVEN"     UA   no evidence of UTI     Urine analysis:    Component Value Date/Time   COLORURINE YELLOW 09/07/2022 1230   APPEARANCEUR CLEAR 09/07/2022 1230   LABSPEC 1.023 09/07/2022 1230   PHURINE 6.0 09/07/2022 1230   GLUCOSEU NEGATIVE 09/07/2022 1230   HGBUR NEGATIVE 09/07/2022 1230   BILIRUBINUR NEGATIVE 09/07/2022 1230   KETONESUR NEGATIVE 09/07/2022 1230   PROTEINUR NEGATIVE 09/07/2022 1230   NITRITE NEGATIVE 09/07/2022 1230   LEUKOCYTESUR NEGATIVE 09/07/2022 1230    Results for orders placed or performed during the hospital encounter of 09/04/09  MRSA PCR Screening     Status: None   Collection Time: 09/05/09 10:43 AM  Result Value Ref Range Status   MRSA by PCR  NEGATIVE Final    NEGATIVE        The GeneXpert MRSA Assay (FDA approved for NASAL specimens only), is one component of a comprehensive MRSA colonization surveillance program. It is not intended to diagnose MRSA infection  nor to guide or monitor treatment for MRSA  infections.     ________________________________________________________________  Venous  Blood Gas result:  pH   7.379 Sodium 141 mmol/L   pCO2, Ven 36.2 Low  mmHg Potassium 3.5 mmol/L  pO2, Ven 43 mmHg      ABG    Component Value Date/Time   PHART 7.406 08/31/2009 1221   PCO2ART 38.7 08/31/2009 1221   PO2ART 61.0 (L) 08/31/2009 1221   HCO3 23.2 08/31/2009 1225   TCO2 24 08/31/2009 1225   ACIDBASEDEF 2.0 08/31/2009 1225   O2SAT 67.0 08/31/2009 1225    __________________________________________________________ Recent Labs  Lab 09/07/22 0624  NA 139  K 3.8  CO2 19*  GLUCOSE 150*  BUN 20  CREATININE 1.10  CALCIUM 9.2    Cr    stable  Lab Results  Component Value Date   CREATININE 1.10 09/07/2022   CREATININE 0.90 10/12/2009   CREATININE 1.09 09/06/2009    Recent Labs  Lab 09/07/22 0725  AST 19  ALT 23  ALKPHOS 49  BILITOT 0.6  PROT 6.0*  ALBUMIN 3.8   Lab Results  Component Value Date   CALCIUM 9.2 09/07/2022       Plt: Lab Results  Component Value Date   PLT 174 09/07/2022    Recent Labs  Lab 09/07/22 0624  WBC 7.8  HGB 14.5  HCT 42.3  MCV 92.0  PLT 174    HG/HCT   stable,       Component Value Date/Time   HGB 14.5 09/07/2022 0624   HGB 13.8 10/12/2009 1128   HCT 42.3 09/07/2022 0624   HCT 39.1 10/12/2009 1128   MCV 92.0 09/07/2022 0624   MCV 89.5 10/12/2009 1128     Recent Labs  Lab 09/07/22 0725  LIPASE 45   No results for input(s): "AMMONIA" in the last 168 hours.    _______________ Hospitalist was called for admission for   Vertigo metabolic acidosis     The following Work up has been ordered so far:  Orders Placed This Encounter  Procedures   DG Chest Portable 1 View   CT Head Wo Contrast   CT Angio Chest PE W/Cm &/Or Wo Cm   MR Brain Wo Contrast (neuro protocol)   Basic metabolic panel   CBC   Urinalysis, Routine w reflex microscopic -Urine, Clean Catch   Hepatic function panel   Lipase, blood   APTT    Protime-INR   Document Height and Actual Weight   ED Cardiac monitoring   Saline Lock IV, Maintain IV access (when placed in a treatment room)   Vital signs   Initiate Carrier Fluid Protocol   If O2 sat If O2 Sat < 94%, administer O2 at 2 liters/minute via nasal cannula.   Check Pulse Oximetry while ambulating   Consult to hospitalist   CBG monitoring, ED   ED EKG   ED EKG   EKG 12-Lead     OTHER Significant initial  Findings:  labs showing:     DM  labs:  HbA1C: No results for input(s): "HGBA1C" in the last 8760 hours.     CBG (last 3)  Recent Labs    09/07/22 0629  GLUCAP 154*          Cultures: No results found for: "SDES", "SPECREQUEST", "CULT", "REPTSTATUS"   Radiological Exams on Admission: MR Brain Wo Contrast (neuro protocol)  Result Date: 09/07/2022 CLINICAL DATA:  Mental status change, unknown cause EXAM: MRI HEAD WITHOUT CONTRAST TECHNIQUE:  Multiplanar, multiecho pulse sequences of the brain and surrounding structures were obtained without intravenous contrast. COMPARISON:  Same-day CT head, brain MR 08/17/2008 FINDINGS: Brain: Negative for an acute infarct. No hemorrhage. No hydrocephalus. No extra-axial fluid collection. Small microhemorrhage adjacent to the splenium of the corpus callosum on the right. There is sequela of mild overall chronic microvascular ischemic change. Vascular: Normal flow voids. Skull and upper cervical spine: Normal marrow signal. Sinuses/Orbits: Trace bilateral mastoid effusions. Paranasal sinuses are clear. Orbits are unremarkable. Other: None. IMPRESSION: No acute intracranial process. Electronically Signed   By: Lorenza Cambridge M.D.   On: 09/07/2022 16:19   CT Angio Chest PE W/Cm &/Or Wo Cm  Result Date: 09/07/2022 CLINICAL DATA:  Diaphoresis and shortness of breath. Generalized weakness. Dizziness. Clinical concern for pulmonary embolus. EXAM: CT ANGIOGRAPHY CHEST WITH CONTRAST TECHNIQUE: Multidetector CT imaging of the chest was  performed using the standard protocol during bolus administration of intravenous contrast. Multiplanar CT image reconstructions and MIPs were obtained to evaluate the vascular anatomy. RADIATION DOSE REDUCTION: This exam was performed according to the departmental dose-optimization program which includes automated exposure control, adjustment of the mA and/or kV according to patient size and/or use of iterative reconstruction technique. CONTRAST:  75mL OMNIPAQUE IOHEXOL 350 MG/ML SOLN COMPARISON:  01/12/2008.  CT heart study 03/06/2022 FINDINGS: Cardiovascular: The heart size is normal. No substantial pericardial effusion. No thoracic aortic aneurysm. No substantial atherosclerosis of the thoracic aorta. Suboptimal bolus timing without definite evidence for acute pulmonary embolus. Mediastinum/Nodes: No mediastinal lymphadenopathy. There is no hilar lymphadenopathy. The esophagus has normal imaging features. There is no axillary lymphadenopathy. Lungs/Pleura: Dependent atelectasis noted in the lung bases. No focal airspace consolidation. No pleural effusion. No suspicious pulmonary nodule or mass. Upper Abdomen: The liver shows diffusely decreased attenuation suggesting fat deposition. Musculoskeletal: No worrisome lytic or sclerotic osseous abnormality. Review of the MIP images confirms the above findings. IMPRESSION: 1. Suboptimal bolus timing without definite evidence for acute pulmonary embolus. 2. Dependent atelectasis in the lung bases. 3. Hepatic steatosis. Electronically Signed   By: Kennith Center M.D.   On: 09/07/2022 11:30   CT Head Wo Contrast  Result Date: 09/07/2022 CLINICAL DATA:  Syncope/presyncope with cerebrovascular cause suspected. EXAM: CT HEAD WITHOUT CONTRAST TECHNIQUE: Contiguous axial images were obtained from the base of the skull through the vertex without intravenous contrast. RADIATION DOSE REDUCTION: This exam was performed according to the departmental dose-optimization program  which includes automated exposure control, adjustment of the mA and/or kV according to patient size and/or use of iterative reconstruction technique. COMPARISON:  Brain MRI 08/17/2008 FINDINGS: Brain: No evidence of acute infarction, hemorrhage, hydrocephalus, extra-axial collection or mass lesion/mass effect.Mild low-density in the cerebral white matter usually from chronic small vessel disease. Vascular: No hyperdense vessel or unexpected calcification. Skull: Normal. Negative for fracture or focal lesion. Sinuses/Orbits: No acute finding. IMPRESSION: No acute finding. Electronically Signed   By: Tiburcio Pea M.D.   On: 09/07/2022 11:21   DG Chest Portable 1 View  Result Date: 09/07/2022 CLINICAL DATA:  Shortness of breath.  Dizziness and weakness. EXAM: PORTABLE CHEST 1 VIEW COMPARISON:  09/24/2016 FINDINGS: Heart size is normal accounting for AP position. There is minimal subsegmental atelectasis in the LEFT lung base. Or early infiltrate in the LEFT lung base. No evidence for pulmonary edema. Patient has superior vena cava stent. IMPRESSION: LEFT base atelectasis or early infiltrate.  The Electronically Signed   By: Norva Pavlov M.D.   On: 09/07/2022 06:47   _______________________________________________________________________________________________________  Latest  Blood pressure 136/85, pulse 75, temperature 98 F (36.7 C), temperature source Oral, resp. rate 15, height 6\' 1"  (1.854 m), weight 129.3 kg, SpO2 97 %.   Vitals  labs and radiology finding personally reviewed  Review of Systems:    Pertinent positives include:   nausea, vomiting, vertigo  Constitutional:  No weight loss, night sweats, Fevers, chills, fatigue, weight loss  HEENT:  No headaches, Difficulty swallowing,Tooth/dental problems,Sore throat,  No sneezing, itching, ear ache, nasal congestion, post nasal drip,  Cardio-vascular:  No chest pain, Orthopnea, PND, anasarca, dizziness, palpitations.no Bilateral lower  extremity swelling  GI:  No heartburn, indigestion, abdominal pain, diarrhea, change in bowel habits, loss of appetite, melena, blood in stool, hematemesis Resp:  no shortness of breath at rest. No dyspnea on exertion, No excess mucus, no productive cough, No non-productive cough, No coughing up of blood.No change in color of mucus.No wheezing. Skin:  no rash or lesions. No jaundice GU:  no dysuria, change in color of urine, no urgency or frequency. No straining to urinate.  No flank pain.  Musculoskeletal:  No joint pain or no joint swelling. No decreased range of motion. No back pain.  Psych:  No change in mood or affect. No depression or anxiety. No memory loss.  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 He has never used smokeless tobacco. He reports current alcohol use. He reports that he does not use drugs.     Family History:  Family History  Problem Relation Age of Onset   Asthma Mother    Diabetes Mellitus I Mother    Pancreatic cancer Mother    Kidney failure Mother        dialysis   Throat cancer Father    Diabetes Mellitus I Maternal Grandmother    Alzheimer's disease Maternal Grandmother    Heart disease Maternal Grandfather     ______________________________________________________________________________________________ Allergies: Allergies  Allergen Reactions   Iohexol      Desc: Pt began having sob, with sneezing and immed vomiting post contrast (multihance), Onset Date: [id number]      Prior to Admission medications   Medication Sig Start Date End Date Taking? Authorizing Provider  EPINEPHrine (EPIPEN 2-PAK) 0.3 mg/0.3 mL IJ SOAJ injection Inject 0.3 mg into the muscle as needed. 03/05/22   [provider]  losartan (COZAAR) 50 MG tablet Take 50 mg by mouth daily.    [provider]  meclizine (ANTIVERT) 25 MG tablet Take 1 tablet (25 mg total) by mouth 3 (three) times daily as needed for up to 7 days for dizziness. 09/07/22 09/14/22  Chase Caller, MD  Pseudoeph-Doxylamine-DM-APAP (NYQUIL PO) Take by mouth. Takes every night for sleep    [provider]  ___________________________________________________________________________________________________ Physical Exam:    09/07/2022    6:20 PM 09/07/2022    3:15 PM 09/07/2022    3:00 PM  Vitals with BMI  Systolic 136 123 102  Diastolic 85 74 79  Pulse 75 67 78    1. General:  in No  Acute distress   well -appearing 2. Psychological: Alert and   Oriented 3. Head/ENT Dry Mucous Membranes                          Head Non traumatic, neck supple                          Poor Dentition 4. SKIN:  decreased Skin turgor,  Skin clean Dry and intact no rash    5. Heart: Regular rate and rhythm no  Murmur, no Rub or gallop 6. Lungs:  Clear to auscultation bilaterally, no wheezes or crackles   7. Abdomen: Soft,  non-tender, Non distended  bowel sounds present 8. Lower extremities: no clubbing, cyanosis, no  edema 9. Neurologically  strength 5 out of 5 in all 4 extremities cranial nerves II through XII intact Evidence of torsional nystagmus 10. MSK: Normal range of motion    Chart has been  reviewed  ______________________________________________________________________________________________  Assessment/Plan  52 y.o. male with medical history significant of DM2, testicular CA sp chemo/radiation, sleep apnea obesity, hypertension history of syncope   Admitted for   Vertigo metabolic acidosis     Present on Admission:  Metabolic acidosis  Dehydration  Hypertension  Hepatic steatosis     Vertigo Vestibular neuritis is on deferential MRI brain neg and encouraging  If any evidence of mustoiditis or progressive ear discomfort can obtain MRI of ear canals and ENT consult if needed Hold off on first dose until MRI has been obtianed  For today add decadron 8 mg iV x1 and cont q 4 every day  To see if there is any imporvement Will need neurology follow up   And vestibular rehab referal  Metabolic acidosis VBG non acute can be induced by  phenteramine will hold for tonight     Dehydration Will rehydrate  Check orthostatics prior to dc  Hypertension Allow permissive htn for tonight  Hepatic steatosis Check lipid panel Encourage decrease ETOH intake   Other plan as per orders.  DVT prophylaxis:  SCD      Code Status:    Code Status: Not on file FULL CODE   as per patient  I had personally discussed CODE STATUS with patient and family  ACP none   Family Communication:   Family  at  Bedside  plan of care was discussed with  Wife,    Diet  regular   Disposition Plan:    To home once workup is complete and patient is stable   Following barriers for discharge:                            Electrolytes corrected                                    Consult Orders  (From admission, onward)           Start     Ordered   09/08/22 1948  Nutritional services consult  Once  Provider:  (Not yet assigned)  Question:  Reason for Consult?  Answer:  assess nutrtional status   09/07/22 1949   09/07/22 1805  Consult to hospitalist  Pg by Viviann Spare  Once        Provider:  (Not yet assigned)  Question Answer Comment  Place call to: [facility]   Reason for Consult Admit      09/07/22 1804                               Would benefit from PT/OT eval prior to DC  Ordered                                     Nutrition    consulted          Consults called: discussed with neurology  Will need out pt follow up ,   Re consult in AM if no improvement and abnormal work up may need ENT consult if evidence of inner ear lesion   Admission status:  ED Disposition     ED Disposition  Admit   Condition  --   Comment  I, Chase Caller, MD, have physically assessed this patient and they appear reasonably screened and/or stabilized for discharge and I doubt any other medical condition or other [facility] requiring further screening, evaluation, or treatment in the ED  exists or is present at this time prior to discharge.          Obs    Level of care     tele  For  24H    Chynah Orihuela 09/07/2022, 9:29 PM    [facility]     after 2 AM please page floor coverage PA If 7AM-7PM, please contact the day team taking care of the patient using [email or web address]

## 2022-09-07 NOTE — ED Notes (Signed)
Rn called to room pt became dizzy wile getting ambulatory pulse ox. Pt was sitting at the side of the bed . Blood pressure obtained 133/92 , hr 77, spo2 100%. Pt reported feeling less dizzy. PA informed.

## 2022-09-07 NOTE — ED Notes (Signed)
Attempted phone report to floor x 3

## 2022-09-07 NOTE — ED Notes (Signed)
ED TO INPATIENT HANDOFF REPORT  ED Nurse Name and Phone #: Tobi Bastos  [phone]   S Name/Age/Gender Cameron Rivas 52 y.o. male Room/Bed: 016C/016C  Code Status   Code Status: Full Code  Home/SNF/Other Home Patient oriented to: self, place, time, situation Is this baseline? Yes   Triage Complete: Triage complete  Chief Complaint Vertigo [R42]  Triage Note Pt BIB [facility] d/t waking up with dizziness, generalized weakness, N/V, pale, cool, diaphoretic and SOB.     Allergies Allergies  Allergen Reactions   Iohexol      Desc: Pt began having sob, with sneezing and immed vomiting post contrast (multihance), Onset Date: [id number]     Level of Care/Admitting Diagnosis ED Disposition     ED Disposition  Admit   Condition  --   Comment  Hospital Area: [facility] [[id number]]  Level of Care: Telemetry Medical [104]  May place patient in observation at [facility] or [facility] if equivalent level of care is available:: No  Covid Evaluation: Asymptomatic - no recent exposure (last 10 days) testing not required  Diagnosis: Vertigo [207257]  Admitting Physician: Therisa Doyne [[id number]]  Attending Physician: Therisa Doyne [[id number]]  Bed request comments: metabolic acidosis          B Medical/Surgery History Past Medical History:  Diagnosis Date   History of chemotherapy    radiation   Syncope    Testicular cancer [facility])    Tumor    renal, spinal, 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)   Past Surgical History:  Procedure Laterality Date   CARDIAC CATHETERIZATION     2010 or 2011   testicle Left    removed 2004   WISDOM TOOTH EXTRACTION     1993     A IV Location/Drains/Wounds Patient Lines/Drains/Airways Status     Active Line/Drains/Airways     Name Placement date Placement time Site Days   Peripheral IV 09/07/22 18 G Anterior;Distal;Right;Upper Arm 09/07/22  0550  Arm  less than 1            Intake/Output Last 24 hours  Intake/Output  Summary (Last 24 hours) at 09/07/2022 1952 Last data filed at 09/07/2022 0849 Gross per 24 hour  Intake 999 ml  Output --  Net 999 ml    Labs/Imaging Results for orders placed or performed during the hospital encounter of 09/07/22 (from the past 48 hour(s))  Basic metabolic panel     Status: Abnormal   Collection Time: 09/07/22  6:24 AM  Result Value Ref Range   Sodium 139 135 - 145 mmol/L   Potassium 3.8 3.5 - 5.1 mmol/L   Chloride 110 98 - 111 mmol/L   CO2 19 (L) 22 - 32 mmol/L   Glucose, Bld 150 (H) 70 - 99 mg/dL    Comment: Glucose reference range applies only to samples taken after fasting for at least 8 hours.   BUN 20 6 - 20 mg/dL   Creatinine, Ser 1.02 0.61 - 1.24 mg/dL   Calcium 9.2 8.9 - 72.5 mg/dL   GFR, Estimated >36 >64 mL/min    Comment: (NOTE) Calculated using the CKD-EPI Creatinine Equation (2021)    Anion gap 10 5 - 15    Comment: Performed at [facility], [address], [place], [place] [address]  CBC     Status: None   Collection Time: 09/07/22  6:24 AM  Result Value Ref Range   WBC 7.8 4.0 - 10.5 K/uL   RBC 4.60 4.22 -  5.81 MIL/uL   Hemoglobin 14.5 13.0 - 17.0 g/dL   HCT 11.9 14.7 - 82.9 %   MCV 92.0 80.0 - 100.0 fL   MCH 31.5 26.0 - 34.0 pg   MCHC 34.3 30.0 - 36.0 g/dL   RDW 56.2 13.0 - 86.5 %   Platelets 174 150 - 400 K/uL   nRBC 0.0 0.0 - 0.2 %    Comment: Performed at [facility], [address], [place], [place] [address]  CBG monitoring, ED     Status: Abnormal   Collection Time: 09/07/22  6:29 AM  Result Value Ref Range   Glucose-Capillary 154 (H) 70 - 99 mg/dL    Comment: Glucose reference range applies only to samples taken after fasting for at least 8 hours.   Comment 1 Notify RN   Hepatic function panel     Status: Abnormal   Collection Time: 09/07/22  7:25 AM  Result Value Ref Range   Total Protein 6.0 (L) 6.5 - 8.1 g/dL   Albumin 3.8 3.5 - 5.0 g/dL   AST 19 15 - 41 U/L   ALT 23 0 - 44 U/L   Alkaline Phosphatase 49 38  - 126 U/L   Total Bilirubin 0.6 0.3 - 1.2 mg/dL   Bilirubin, Direct 0.1 0.0 - 0.2 mg/dL   Indirect Bilirubin 0.5 0.3 - 0.9 mg/dL    Comment: Performed at [facility], [address], [place], [place] [address]  Lipase, blood     Status: None   Collection Time: 09/07/22  7:25 AM  Result Value Ref Range   Lipase 45 11 - 51 U/L    Comment: Performed at [facility], [address] [place], [place] [address]  Troponin I (High Sensitivity)     Status: None   Collection Time: 09/07/22  7:25 AM  Result Value Ref Range   Troponin I (High Sensitivity) 4 <18 ng/L    Comment: (NOTE) Elevated high sensitivity troponin I (hsTnI) values and significant  changes across serial measurements may suggest ACS but many other  chronic and acute conditions are known to elevate hsTnI results.  Refer to the "Links" section for chest pain algorithms and additional  guidance. Performed at [facility], [address], [place], [place] [address]   APTT     Status: None   Collection Time: 09/07/22  7:25 AM  Result Value Ref Range   aPTT 24 24 - 36 seconds    Comment: Performed at [facility], [address] [place], [place] [address]  Protime-INR     Status: None   Collection Time: 09/07/22  7:25 AM  Result Value Ref Range   Prothrombin Time 13.6 11.4 - 15.2 seconds   INR 1.0 0.8 - 1.2    Comment: (NOTE) INR goal varies based on device and disease states. Performed at [facility], [address], [place], [place] [address]   Troponin I (High Sensitivity)     Status: None   Collection Time: 09/07/22  9:21 AM  Result Value Ref Range   Troponin I (High Sensitivity) 6 <18 ng/L    Comment: (NOTE) Elevated high sensitivity troponin I (hsTnI) values and significant  changes across serial measurements may suggest ACS but many other  chronic and acute conditions are known to elevate hsTnI results.  Refer to the "Links" section for chest pain algorithms and additional   guidance. Performed at [facility], [address], [place], [place] [address]   Urinalysis, Routine w reflex microscopic -Urine, Clean Catch     Status: None   Collection Time: 09/07/22 12:30 PM  Result Value Ref Range   Color, Urine YELLOW YELLOW   APPearance CLEAR CLEAR   Specific Gravity, Urine 1.023 1.005 - 1.030   pH 6.0 5.0 - 8.0   Glucose, UA NEGATIVE NEGATIVE mg/dL   Hgb urine dipstick NEGATIVE NEGATIVE   Bilirubin Urine NEGATIVE NEGATIVE   Ketones, ur NEGATIVE NEGATIVE mg/dL   Protein, ur NEGATIVE NEGATIVE mg/dL   Nitrite NEGATIVE NEGATIVE   Leukocytes,Ua NEGATIVE NEGATIVE    Comment: Performed at [facility], [address] [place], [place] [address]  I-Stat venous blood gas, ED     Status: Abnormal   Collection Time: 09/07/22  7:48 PM  Result Value Ref Range   pH, Ven 7.379 7.25 - 7.43   pCO2, Ven 36.2 (L) 44 - 60 mmHg   pO2, Ven 43 32 - 45 mmHg   Bicarbonate 21.4 20.0 - 28.0 mmol/L   TCO2 22 22 - 32 mmol/L   O2 Saturation 77 %   Acid-base deficit 3.0 (H) 0.0 - 2.0 mmol/L   Sodium 141 135 - 145 mmol/L   Potassium 3.5 3.5 - 5.1 mmol/L   Calcium, Ion 1.20 1.15 - 1.40 mmol/L   HCT 41.0 39.0 - 52.0 %   Hemoglobin 13.9 13.0 - 17.0 g/dL   Sample type VENOUS    MR Brain Wo Contrast (neuro protocol)  Result Date: 09/07/2022 CLINICAL DATA:  Mental status change, unknown cause EXAM: MRI HEAD WITHOUT CONTRAST TECHNIQUE: Multiplanar, multiecho pulse sequences of the brain and surrounding structures were obtained without intravenous contrast. COMPARISON:  Same-day CT head, brain MR 08/17/2008 FINDINGS: Brain: Negative for an acute infarct. No hemorrhage. No hydrocephalus. No extra-axial fluid collection. Small microhemorrhage adjacent to the splenium of the corpus callosum on the right. There is sequela of mild overall chronic microvascular ischemic change. Vascular: Normal flow voids. Skull and upper cervical spine: Normal marrow signal. Sinuses/Orbits: Trace  bilateral mastoid effusions. Paranasal sinuses are clear. Orbits are unremarkable. Other: None. IMPRESSION: No acute intracranial process. Electronically Signed   By: Lorenza Cambridge M.D.   On: 09/07/2022 16:19   CT Angio Chest PE W/Cm &/Or Wo Cm  Result Date: 09/07/2022 CLINICAL DATA:  Diaphoresis and shortness of breath. Generalized weakness. Dizziness. Clinical concern for pulmonary embolus. EXAM: CT ANGIOGRAPHY CHEST WITH CONTRAST TECHNIQUE: Multidetector CT imaging of the chest was performed using the standard protocol during bolus administration of intravenous contrast. Multiplanar CT image reconstructions and MIPs were obtained to evaluate the vascular anatomy. RADIATION DOSE REDUCTION: This exam was performed according to the departmental dose-optimization program which includes automated exposure control, adjustment of the mA and/or kV according to patient size and/or use of iterative reconstruction technique. CONTRAST:  75mL OMNIPAQUE IOHEXOL 350 MG/ML SOLN COMPARISON:  01/12/2008.  CT heart study 03/06/2022 FINDINGS: Cardiovascular: The heart size is normal. No substantial pericardial effusion. No thoracic aortic aneurysm. No substantial atherosclerosis of the thoracic aorta. Suboptimal bolus timing without definite evidence for acute pulmonary embolus. Mediastinum/Nodes: No mediastinal lymphadenopathy. There is no hilar lymphadenopathy. The esophagus has normal imaging features. There is no axillary lymphadenopathy. Lungs/Pleura: Dependent atelectasis noted in the lung bases. No focal airspace consolidation. No pleural effusion. No suspicious pulmonary nodule or mass. Upper Abdomen: The liver shows diffusely decreased attenuation suggesting fat deposition. Musculoskeletal: No worrisome lytic or sclerotic osseous abnormality. Review of the MIP images confirms  the above findings. IMPRESSION: 1. Suboptimal bolus timing without definite evidence for acute pulmonary embolus. 2. Dependent atelectasis in the  lung bases. 3. Hepatic steatosis. Electronically Signed   By: Kennith Center M.D.   On: 09/07/2022 11:30   CT Head Wo Contrast  Result Date: 09/07/2022 CLINICAL DATA:  Syncope/presyncope with cerebrovascular cause suspected. EXAM: CT HEAD WITHOUT CONTRAST TECHNIQUE: Contiguous axial images were obtained from the base of the skull through the vertex without intravenous contrast. RADIATION DOSE REDUCTION: This exam was performed according to the departmental dose-optimization program which includes automated exposure control, adjustment of the mA and/or kV according to patient size and/or use of iterative reconstruction technique. COMPARISON:  Brain MRI 08/17/2008 FINDINGS: Brain: No evidence of acute infarction, hemorrhage, hydrocephalus, extra-axial collection or mass lesion/mass effect.Mild low-density in the cerebral white matter usually from chronic small vessel disease. Vascular: No hyperdense vessel or unexpected calcification. Skull: Normal. Negative for fracture or focal lesion. Sinuses/Orbits: No acute finding. IMPRESSION: No acute finding. Electronically Signed   By: Tiburcio Pea M.D.   On: 09/07/2022 11:21   DG Chest Portable 1 View  Result Date: 09/07/2022 CLINICAL DATA:  Shortness of breath.  Dizziness and weakness. EXAM: PORTABLE CHEST 1 VIEW COMPARISON:  09/24/2016 FINDINGS: Heart size is normal accounting for AP position. There is minimal subsegmental atelectasis in the LEFT lung base. Or early infiltrate in the LEFT lung base. No evidence for pulmonary edema. Patient has superior vena cava stent. IMPRESSION: LEFT base atelectasis or early infiltrate.  The Electronically Signed   By: Norva Pavlov M.D.   On: 09/07/2022 06:47    Pending Labs Unresulted Labs (From admission, onward)     Start     Ordered   09/07/22 1926  Protime-INR  Once,   R       Question:  Release to patient  Answer:  Immediate   09/07/22 1925   09/07/22 1925  Ethanol  Add-on,   AD        09/07/22 1924    09/07/22 1925  Rapid urine drug screen (hospital performed)  ONCE - STAT,   STAT        09/07/22 1924   09/07/22 1923  Salicylate level  Once,   R        09/07/22 1922   09/07/22 1921  Lactic acid, plasma  STAT Now then every 3 hours,   R (with STAT occurrences)     Question:  Release to patient  Answer:  Immediate   09/07/22 1921   09/07/22 1921  Magnesium  Add-on,   AD        09/07/22 1921   09/07/22 1921  Phosphorus  Add-on,   AD        09/07/22 1921   09/07/22 1921  Comprehensive metabolic panel  Once,   R       Question:  Release to patient  Answer:  Immediate   09/07/22 1921   09/07/22 1921  CK  Once,   R       Question:  Release to patient  Answer:  Immediate   09/07/22 1921   09/07/22 1921  TSH  Once,   R       Question:  Release to patient  Answer:  Immediate   09/07/22 1921   09/07/22 1920  Blood gas, venous  ONCE - STAT,   STAT        09/07/22 1919   Signed and Held  HIV Antibody (routine testing w rflx)  (  HIV Antibody (Routine testing w reflex) panel)  Once,   R        Signed and Held   Signed and Held  Magnesium  Tomorrow morning,   R        Signed and Held   Signed and Held  Phosphorus  Tomorrow morning,   R        Signed and Held   Signed and Held  Comprehensive metabolic panel  Tomorrow morning,   R       Question:  Release to patient  Answer:  Immediate   Signed and Held   Signed and Held  CBC  Tomorrow morning,   R       Question:  Release to patient  Answer:  Immediate   Signed and Held            Vitals/Pain Today's Vitals   09/07/22 1820 09/07/22 1900 09/07/22 1915 09/07/22 1930  BP: 136/85 129/74 131/74 117/74  Pulse: 75 74 73 70  Resp: 15 15 14 13   Temp: 98 F (36.7 C)     TempSrc: Oral     SpO2: 97% 95% 96% 96%  Weight:      Height:      PainSc:        Isolation Precautions No active isolations  Medications Medications  diphenhydrAMINE (BENADRYL) capsule 50 mg ( Oral See Alternative 09/07/22 0959)    Or  diphenhydrAMINE (BENADRYL)  injection 50 mg (50 mg Intravenous Given 09/07/22 0959)  sodium chloride 0.9 % bolus 500 mL (0 mLs Intravenous Stopped 09/07/22 0849)  ondansetron (ZOFRAN) injection 4 mg (4 mg Intravenous Given 09/07/22 0644)  iohexol (OMNIPAQUE) 350 MG/ML injection 75 mL (75 mLs Intravenous Contrast Given 09/07/22 1118)  meclizine (ANTIVERT) tablet 25 mg (25 mg Oral Given 09/07/22 1335)  meclizine (ANTIVERT) tablet 25 mg (25 mg Oral Given 09/07/22 1520)  diazepam (VALIUM) tablet 5 mg (5 mg Oral Given 09/07/22 1645)    Mobility walks with person assist     Focused Assessments Cardiac Assessment Handoff:    No results found for: "CKTOTAL", "CKMB", "CKMBINDEX", "TROPONINI" No results found for: "DDIMER" Does the Patient currently have chest pain? No    R Recommendations: See Admitting Provider Note  Report given to:   Additional Notes:

## 2022-09-07 NOTE — Assessment & Plan Note (Signed)
VBG non acute can be induced by  phenteramine will hold for tonight

## 2022-09-07 NOTE — ED Notes (Signed)
Patient transported to CT 

## 2022-09-07 NOTE — ED Provider Notes (Signed)
[facility] AT [facility] Provider Note   CSN: [id number] Arrival date & time: 09/07/22  4132     History  Chief Complaint  Patient presents with   Dizziness    Cameron Rivas is a 52 y.o. male with PMHx HTN who presents to ED complaining of nausea, dizziness, SOB and generalized weakness. Symptoms started at 4:30AM this morning when patient woke up to use the bathroom and noted that the "room was spinning". Patient states that the dizziness and nausea makes it feel like he is not breathing well. Last known normal 9:30PM (6/21).  Denies fever, chest pain, cough, abdominal pain, vomiting, diarrhea, recent illness. Denies hx DVT/PE, recent trauma/surgery, recent immobilization, calf swelling/tenderness.   Dizziness      Home Medications Prior to Admission medications   Medication Sig Start Date End Date Taking? Authorizing Provider  EPINEPHrine (EPIPEN 2-PAK) 0.3 mg/0.3 mL IJ SOAJ injection Inject 0.3 mg into the muscle as needed. 03/05/22   [provider]  losartan (COZAAR) 50 MG tablet Take 50 mg by mouth daily.    [provider]  Pseudoeph-Doxylamine-DM-APAP (NYQUIL PO) Take by mouth. Takes every night for sleep    [provider]      Allergies    Iohexol    Review of Systems   Review of Systems  Neurological:  Positive for dizziness.    Physical Exam Updated Vital Signs BP 135/89   Pulse 84   Temp 97.6 F (36.4 C) (Oral)   Resp 15   Ht 6\' 1"  (1.854 m)   Wt 129.3 kg   SpO2 98%   BMI 37.60 kg/m  Physical Exam Vitals and nursing note reviewed.  Constitutional:      General: He is not in acute distress. HENT:     Head: Normocephalic and atraumatic.     Mouth/Throat:     Mouth: Mucous membranes are moist.     Pharynx: No oropharyngeal exudate or posterior oropharyngeal erythema.  Eyes:     General: No scleral icterus.       Right eye: No discharge.        Left eye: No discharge.      Conjunctiva/sclera: Conjunctivae normal.  Cardiovascular:     Rate and Rhythm: Normal rate and regular rhythm.     Pulses: Normal pulses.     Heart sounds: Normal heart sounds. No murmur heard.    Comments: +2 pedal and radial pulse. Pulmonary:     Effort: Pulmonary effort is normal. No respiratory distress.     Breath sounds: Normal breath sounds. No wheezing, rhonchi or rales.  Abdominal:     General: Abdomen is flat. Bowel sounds are normal.     Palpations: Abdomen is soft. There is no mass.     Tenderness: There is no abdominal tenderness.  Musculoskeletal:     Right lower leg: No edema.     Left lower leg: No edema.     Comments: No tenderness to palpation of calf. No BL LE edema or swelling.   Skin:    General: Skin is warm and dry.     Findings: No rash.  Neurological:     General: No focal deficit present.     Mental Status: He is alert. Mental status is at baseline.     Cranial Nerves: No cranial nerve deficit.     Sensory: No sensory deficit.     Coordination: Coordination normal.     Comments: GCS 15. Speech is  goal oriented. No deficits appreciated to CN III-XII; symmetric eyebrow raise, no facial drooping, tongue midline. Patient has equal grip strength bilaterally with 5/5 strength against resistance in all major muscle groups bilaterally. Sensation to light touch intact. Patient moves extremities without ataxia. Normal finger-nose-finger.   Psychiatric:        Mood and Affect: Mood normal.     ED Results / Procedures / Treatments   Labs (all labs ordered are listed, but only abnormal results are displayed) Labs Reviewed  BASIC METABOLIC PANEL - Abnormal; Notable for the following components:      Result Value   CO2 19 (*)    Glucose, Bld 150 (*)    All other components within normal limits  HEPATIC FUNCTION PANEL - Abnormal; Notable for the following components:   Total Protein 6.0 (*)    All other components within normal limits  CBG MONITORING, ED -  Abnormal; Notable for the following components:   Glucose-Capillary 154 (*)    All other components within normal limits  CBC  URINALYSIS, ROUTINE W REFLEX MICROSCOPIC  LIPASE, BLOOD  APTT  PROTIME-INR  TROPONIN I (HIGH SENSITIVITY)  TROPONIN I (HIGH SENSITIVITY)    EKG EKG Interpretation  Date/Time:  Saturday September 07 2022 06:27:53 EDT Ventricular Rate:  66 PR Interval:  210 QRS Duration: 98 QT Interval:  404 QTC Calculation: 424 R Axis:   15 Text Interpretation: Sinus rhythm Prolonged PR interval Abnormal R-wave progression, early transition Confirmed by Alona Bene [phone]) on 09/07/2022 6:29:04 AM  Radiology CT Angio Chest PE W/Cm &/Or Wo Cm  Result Date: 09/07/2022 CLINICAL DATA:  Diaphoresis and shortness of breath. Generalized weakness. Dizziness. Clinical concern for pulmonary embolus. EXAM: CT ANGIOGRAPHY CHEST WITH CONTRAST TECHNIQUE: Multidetector CT imaging of the chest was performed using the standard protocol during bolus administration of intravenous contrast. Multiplanar CT image reconstructions and MIPs were obtained to evaluate the vascular anatomy. RADIATION DOSE REDUCTION: This exam was performed according to the departmental dose-optimization program which includes automated exposure control, adjustment of the mA and/or kV according to patient size and/or use of iterative reconstruction technique. CONTRAST:  75mL OMNIPAQUE IOHEXOL 350 MG/ML SOLN COMPARISON:  01/12/2008.  CT heart study 03/06/2022 FINDINGS: Cardiovascular: The heart size is normal. No substantial pericardial effusion. No thoracic aortic aneurysm. No substantial atherosclerosis of the thoracic aorta. Suboptimal bolus timing without definite evidence for acute pulmonary embolus. Mediastinum/Nodes: No mediastinal lymphadenopathy. There is no hilar lymphadenopathy. The esophagus has normal imaging features. There is no axillary lymphadenopathy. Lungs/Pleura: Dependent atelectasis noted in the lung bases. No  focal airspace consolidation. No pleural effusion. No suspicious pulmonary nodule or mass. Upper Abdomen: The liver shows diffusely decreased attenuation suggesting fat deposition. Musculoskeletal: No worrisome lytic or sclerotic osseous abnormality. Review of the MIP images confirms the above findings. IMPRESSION: 1. Suboptimal bolus timing without definite evidence for acute pulmonary embolus. 2. Dependent atelectasis in the lung bases. 3. Hepatic steatosis. Electronically Signed   By: Kennith Center M.D.   On: 09/07/2022 11:30   CT Head Wo Contrast  Result Date: 09/07/2022 CLINICAL DATA:  Syncope/presyncope with cerebrovascular cause suspected. EXAM: CT HEAD WITHOUT CONTRAST TECHNIQUE: Contiguous axial images were obtained from the base of the skull through the vertex without intravenous contrast. RADIATION DOSE REDUCTION: This exam was performed according to the departmental dose-optimization program which includes automated exposure control, adjustment of the mA and/or kV according to patient size and/or use of iterative reconstruction technique. COMPARISON:  Brain MRI 08/17/2008 FINDINGS: Brain: No  evidence of acute infarction, hemorrhage, hydrocephalus, extra-axial collection or mass lesion/mass effect.Mild low-density in the cerebral white matter usually from chronic small vessel disease. Vascular: No hyperdense vessel or unexpected calcification. Skull: Normal. Negative for fracture or focal lesion. Sinuses/Orbits: No acute finding. IMPRESSION: No acute finding. Electronically Signed   By: Tiburcio Pea M.D.   On: 09/07/2022 11:21   DG Chest Portable 1 View  Result Date: 09/07/2022 CLINICAL DATA:  Shortness of breath.  Dizziness and weakness. EXAM: PORTABLE CHEST 1 VIEW COMPARISON:  09/24/2016 FINDINGS: Heart size is normal accounting for AP position. There is minimal subsegmental atelectasis in the LEFT lung base. Or early infiltrate in the LEFT lung base. No evidence for pulmonary edema. Patient  has superior vena cava stent. IMPRESSION: LEFT base atelectasis or early infiltrate.  The Electronically Signed   By: Norva Pavlov M.D.   On: 09/07/2022 06:47    Procedures Procedures    Medications Ordered in ED Medications  diphenhydrAMINE (BENADRYL) capsule 50 mg ( Oral See Alternative 09/07/22 0959)    Or  diphenhydrAMINE (BENADRYL) injection 50 mg (50 mg Intravenous Given 09/07/22 0959)  meclizine (ANTIVERT) tablet 25 mg (has no administration in time range)  sodium chloride 0.9 % bolus 500 mL (0 mLs Intravenous Stopped 09/07/22 0849)  ondansetron (ZOFRAN) injection 4 mg (4 mg Intravenous Given 09/07/22 0644)  iohexol (OMNIPAQUE) 350 MG/ML injection 75 mL (75 mLs Intravenous Contrast Given 09/07/22 1118)  meclizine (ANTIVERT) tablet 25 mg (25 mg Oral Given 09/07/22 1335)    ED Course/ Medical Decision Making/ A&P                             Medical Decision Making  This patient presents to the ED for concern of shortness of breath, this involves an extensive number of treatment options, and is a complaint that carries with it a high risk of complications and morbidity.  The differential diagnosis includes Anxiety, Anaphylaxis/Angioedema, Aspirated FB, Arrhythmia, CHF, Asthma, COPD, PNA, COVID/Flu/RSV, STEMI, Tamponade, TPNX, DKA, Sepsis, Toxin   Co morbidities that complicate the patient evaluation  HTN, Hx testicular CA (2011)    Lab Tests:  I Ordered, and personally interpreted labs.  The pertinent results include:   -BMP/HFP: no concern for electrolyte abnormality; no concern for kidney/liver damage -Trop: initial and repeat within normal limits -CBC: No concern for anemia or leukocytosis -UA: -PT-INR/APTT: within normal limits -Lipase: within normal limits -CBG: 154   Imaging Studies ordered:  I ordered imaging studies including  -Chest xray: assess for disease contributing to patient's nausea/SOB -CTA chest: assess for patient's hypoxia and to further assess  chest xray findings -CT head: to assess for process contributing to patient's acute vertigo I independently visualized and interpreted imaging I agree with the radiologist interpretation   Cardiac Monitoring: / EKG:  The patient was maintained on a cardiac monitor.  I personally viewed and interpreted the cardiac monitored which showed an underlying rhythm of: sinus rhythm without acute ST changes or arrhythmias   Problem List / ED Course / Critical interventions / Medication management  Patient presents to the ED complaining of nausea, vertigo, SOB.  EKG and troponins reassuring.  Chest x-ray showing possible atelectasis vs developing pneumonia.  Given patient's mild hypoxia, obtaining CTA of the chest to further assess for PNA and rule out PE given patient's history of cancer. CBC, BMP, lipase reassuring. Troponin and EKG without concern for ACS/STEMI. UA without concern for infection. CTA  chest without concern for PE or PNA. CT head without concern for acute abnormality. Patient states that his symptoms improved after benadryl dose. Severity changed from 10/10 -> 2/10.  I believe patient's symptoms are due to vertigo.  Patient concerned to be discharged at this time. Patient requesting further imaging with MRI. I have ordered MRI and will reassess patient while results are pending. Providing patient with Meclizine for symptom management. I have reviewed the patients home medicines and have made adjustments as needed Patient was given return precautions. Patient stable for discharge at this time. Patient verbalized understanding of plan.  DDx: These are considered less likely due to history of present illness and physical exam findings Anaphylaxis/Angioedema: physical exam not concerning Aspirated FB: no history of choking Arrhythmia: EKG with NSR Asthma/COPD/PNA/COVID/Flu/RSV: Lungs clear to auscultation bilaterally and O2 sat 96% STEMI: EKG and Troponin without concern Tamponade: chest  xray without concern CHF: no physical exam findings TPNX: Lungs clear to auscultation bilaterally DKA: no hx DM Sepsis: afebrile and other vital signs stable  3:15PM Care of Cameron Rivas transferred to Dr. Ericka Pontiff at the end of my shift as the patient will require reassessment once labs/imaging have resulted. Patient presentation, ED course, and plan of care discussed with review of all pertinent labs and imaging. Please see his/her note for further details regarding further ED course and disposition. Plan at time of handoff is reassess patient after MRI. If MRI negative - patient should be okay for outpatient follow up. If MRI is concerning, patient may need neuro consult and inpatient admission. This may be altered or completely changed at the discretion of the oncoming team pending results of further workup.    Risk Stratification Score:  Wells: 0   Social Determinants of Health:  none          Final Clinical Impression(s) / ED Diagnoses Final diagnoses:  Vertigo    Rx / DC Orders ED Discharge Orders     None         Margarita Rana 09/07/22 1537    Wynetta Fines, MD 09/07/22 1644

## 2022-09-07 NOTE — Subjective & Objective (Signed)
Patient presents with vertigo new onset, generalized weakness nausea vomiting This started around 4:30 AM when he woke up to use the bathroom and noted that the room was spinning.  He is not able to ambulate since. No associated fevers no chest pain or shortness of breath no diarrhea no recent illness

## 2022-09-07 NOTE — ED Provider Notes (Cosign Needed Addendum)
Medical Decision Making: Care of patient assumed from Cameron Rivas, [place]  at 3:29 PM.  Agree with history.  See their note for further details.  Briefly, 52 y.o. male with PMH/PSH as below.  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  Last known normal last night at 1130.  Woke up with severe vertigo that 4:30 AM.  With associated nausea.  Workup thus far unremarkable currently pending MRI.  After meclizine vertigo symptoms went from 10 out of 10 to 2 out of 10.  Current plan is as follows: Follow-up MRI and reassess  MDM:  -Reviewed and confirmed nursing documentation for past medical history, family history, social history. -Vital signs stable. -Upon reevaluation, patient resting comfortably in bed, hemodynamically stable, no distress.  MRI shows no acute intracranial normalities.  Patient reports persistent improvement in symptoms.  Discussed strict return precautions for ED return.  Discussed follow-up with ENT and PCP.  Upon being discharged, patient had an episode of vertigo with moving from the bed.  Given a dose of Valium.  Patient endorses persistent symptoms.  After discussions with the family they feel he is unsafe for discharge.  I have discussed the patient with hospitalist who agrees to an observation admission. -Patient had no acute events while under my care in the emergency department.     The plan for this patient was discussed with Dr. Lynelle Doctor, who voiced agreement and who oversaw evaluation and treatment of this patient.  Marta Lamas, MD Emergency Medicine, PGY-3  Note: Dragon medical dictation software was used in the  creation of this note.          Chase Caller, MD 09/07/22 Emelda Brothers    Linwood Dibbles, MD 09/10/22 [phone]

## 2022-09-07 NOTE — Assessment & Plan Note (Signed)
Allow permissive htn for tonight 

## 2022-09-07 NOTE — ED Notes (Signed)
Pt ambulated for pulse oximetry check, while walking pt felt light headed and swayed around the room. Pt proceeded to sit down. Staff took blood pressure - 133/92. MD notified.

## 2022-09-07 NOTE — Assessment & Plan Note (Signed)
Will rehydrate  Check orthostatics prior to dc

## 2022-09-07 NOTE — Assessment & Plan Note (Addendum)
Vestibular neuritis is on deferential MRI brain neg and encouraging  If any evidence of mustoiditis or progressive ear discomfort can obtain MRI of ear canals WNL     For today add decadron 8 mg iV x1 and cont q 4 every day  To see if there is any imporvement Will need neurology follow up   And vestibular rehab referal

## 2022-09-07 NOTE — ED Triage Notes (Signed)
Pt BIB [facility] d/t waking up with dizziness, generalized weakness, N/V, pale, cool, diaphoretic and SOB.

## 2022-09-07 NOTE — Assessment & Plan Note (Signed)
Check lipid panel Encourage decrease ETOH intake

## 2022-09-08 DIAGNOSIS — I10 Essential (primary) hypertension: Secondary | ICD-10-CM

## 2022-09-08 DIAGNOSIS — K760 Fatty (change of) liver, not elsewhere classified: Secondary | ICD-10-CM

## 2022-09-08 DIAGNOSIS — R42 Dizziness and giddiness: Principal | ICD-10-CM

## 2022-09-08 DIAGNOSIS — E872 Acidosis, unspecified: Secondary | ICD-10-CM | POA: Diagnosis not present

## 2022-09-08 DIAGNOSIS — E860 Dehydration: Secondary | ICD-10-CM

## 2022-09-08 LAB — COMPREHENSIVE METABOLIC PANEL WITH GFR
ALT: 26 U/L (ref 0–44)
AST: 22 U/L (ref 15–41)
Albumin: 4.1 g/dL (ref 3.5–5.0)
Alkaline Phosphatase: 47 U/L (ref 38–126)
Anion gap: 13 (ref 5–15)
BUN: 14 mg/dL (ref 6–20)
CO2: 21 mmol/L — ABNORMAL LOW (ref 22–32)
Calcium: 9.6 mg/dL (ref 8.9–10.3)
Chloride: 106 mmol/L (ref 98–111)
Creatinine, Ser: 1.01 mg/dL (ref 0.61–1.24)
GFR, Estimated: >60 mL/min
Glucose, Bld: 115 mg/dL — ABNORMAL HIGH (ref 70–99)
Potassium: 3.6 mmol/L (ref 3.5–5.1)
Sodium: 140 mmol/L (ref 135–145)
Total Bilirubin: 0.5 mg/dL (ref 0.3–1.2)
Total Protein: 7.1 g/dL (ref 6.5–8.1)

## 2022-09-08 LAB — LIPID PANEL
Cholesterol: 194 mg/dL (ref 0–200)
HDL: 44 mg/dL (ref >40)
LDL Cholesterol: 121 mg/dL — ABNORMAL HIGH (ref 0–99)
Total CHOL/HDL Ratio: 4.4 RATIO
Triglycerides: 146 mg/dL (ref <150)
VLDL: 29 mg/dL (ref 0–40)

## 2022-09-08 LAB — CBC
HCT: 43.3 % (ref 39.0–52.0)
Hemoglobin: 14.8 g/dL (ref 13.0–17.0)
MCH: 31.4 pg (ref 26.0–34.0)
MCHC: 34.2 g/dL (ref 30.0–36.0)
MCV: 91.7 fL (ref 80.0–100.0)
Platelets: 126 10*3/uL — ABNORMAL LOW (ref 150–400)
RBC: 4.72 MIL/uL (ref 4.22–5.81)
RDW: 12.8 % (ref 11.5–15.5)
WBC: 9.1 10*3/uL (ref 4.0–10.5)
nRBC: 0 % (ref 0.0–0.2)

## 2022-09-08 LAB — BLOOD GAS, VENOUS
Acid-base deficit: 2.9 mmol/L — ABNORMAL HIGH (ref 0.0–2.0)
Bicarbonate: 22.6 mmol/L (ref 20.0–28.0)
Drawn by: 68774
Patient temperature: 36.8
pCO2, Ven: 41 mmHg — ABNORMAL LOW (ref 44–60)
pH, Ven: 7.35 (ref 7.25–7.43)
pO2, Ven: 173 mmHg — ABNORMAL HIGH (ref 32–45)

## 2022-09-08 LAB — MAGNESIUM: Magnesium: 2.1 mg/dL (ref 1.7–2.4)

## 2022-09-08 LAB — PHOSPHORUS: Phosphorus: 3.6 mg/dL (ref 2.5–4.6)

## 2022-09-08 LAB — HIV ANTIBODY (ROUTINE TESTING W REFLEX): HIV Screen 4th Generation wRfx: NONREACTIVE

## 2022-09-08 MED ORDER — DEXAMETHASONE SODIUM PHOSPHATE 10 MG/ML IJ SOLN
8.0000 mg | Freq: Once | INTRAMUSCULAR | Status: AC
  Administered 2022-09-08: 8 mg via INTRAVENOUS
  Filled 2022-09-08: qty 1

## 2022-09-08 MED ORDER — POTASSIUM PHOSPHATES 15 MMOLE/5ML IV SOLN
15.0000 mmol | Freq: Once | INTRAVENOUS | Status: AC
  Administered 2022-09-08: 15 mmol via INTRAVENOUS
  Filled 2022-09-08: qty 5

## 2022-09-08 MED ORDER — DEXAMETHASONE 4 MG PO TABS: 4.0000 mg | ORAL_TABLET | Freq: Every day | ORAL | 0 refills | Status: AC

## 2022-09-08 NOTE — Discharge Summary (Signed)
Physician Discharge Summary  Cameron Rivas UJW:[id number] DOB: May 01, 1970 DOA: 09/07/2022  PCP: Creola Corn, MD  Admit date: 09/07/2022 Discharge date: 09/08/2022 Admitted From: Home Disposition: Home Recommendations for Outpatient Follow-up:  Follow up with PCP and ENT as below Check CMP and CBC at follow-up Please follow up on the following pending results: None  Home Health: Not indicated Equipment/Devices: Not indicated  Discharge Condition: Stable CODE STATUS: Full code  Follow-up Information     [facility]. Schedule an appointment as soon as possible for a visit in 3 days.   Why: follow up on vertigo        Creola Corn, MD .   Specialty: Internal Medicine Why: As needed Contact information: [address] [place] [address] [phone]         [facility] Emergency Department at [facility] .   Specialty: Emergency Medicine Why: If symptoms worsen Contact information: [address] [id number] mc [place] [address] [phone]                Hospital course 52 year old M with PMH of HTN, OSA, testicular cancer s/p chemoradiation in 2010 and obesity presenting with acute vertigo started about 4:30 AM when he turned on his bed to look at the clock before going to the bathroom.  He had associated nausea.  No focal weakness, numbness or tingling.  No changes to speech.  No recent illness.  Has been on Qsymia for weight management was increased few months ago.  As a result, his blood pressure was elevated and his PCP doubled his losartan about a week ago.  Other than that, patient denies new medication.   In ED, stable vitals.  Labs including CMP, CBC, VBG, TSH, PT, INR, UA, salicylate and EtOH levels with unrevealing.  Lactic acid was 2.0 but improved to 1.8.  CT head, MRI brain without contrast and MRI brain with and without contrast unrevealing.  Started on IV fluid and Decadron out of concern  for vestibular neuritis.   The next day, patient's vertigo resolved.  Felt well and ready to go home.  Full neuroexam within normal.  Orthostatic vitals negative.  Evaluated by vestibular PT and OT and cleared for discharge.  Discharged on p.o. Decadron 4 mg daily for 3 days.  Outpatient follow-up with PCP and ENT as above.  Recommended holding Qsymia until he talks to his PCP.  See individual problem list below for more.   Problems addressed during this hospitalization Principal Problem:   Vertigo Active Problems:   Metabolic acidosis   Dehydration   Hypertension   Hepatic steatosis   Hypophosphatemia              Time spent 35 minutes  Vital signs Vitals:   09/07/22 2025 09/08/22 0011 09/08/22 0013 09/08/22 0544  BP: 133/79  128/83 128/80  Pulse: 76  73 73  Temp: 98 F (36.7 C) 98.3 F (36.8 C)  98.1 F (36.7 C)  Resp: 18  18 13   Height:      Weight:      SpO2: 95%  96% 95%  TempSrc: Oral Oral  Oral  BMI (Calculated):         Discharge exam  GENERAL: No apparent distress.  Nontoxic. HEENT: MMM.  Vision and hearing grossly intact.  NECK: Supple.  No apparent JVD.  RESP:  No IWOB.  Fair aeration bilaterally. CVS:  RRR. Heart sounds normal.  ABD/GI/GU: BS+. Abd soft, NTND.  MSK/EXT:  Moves extremities. No apparent deformity. No edema.  SKIN: no apparent skin lesion or wound NEURO: Awake, alert and oriented appropriately. Speech clear. Cranial nerves II-XII  intact. Motor 5/5 in all muscle groups of UE and LE bilaterally, Normal tone. Light sensation intact in all dermatomes of upper and lower ext bilaterally. Patellar reflex symmetric.  No pronator drift or nystagmus.  Finger to nose intact. PSYCH: Calm. Normal affect.   Discharge Instructions Discharge Instructions     Diet - low sodium heart healthy   Complete by: As directed    Discharge instructions   Complete by: As directed    It has been a pleasure taking care of you!  You were hospitalized due to  vertigo (dizziness).  This could be due to some inflammation or crystal in your ear.  Your MRI did not show stroke or any other major finding.  Your symptoms improved.  Continue Decadron for the next 3 days.  Keep yourself hydrated.  Follow-up with your primary care doctor in 1 week.   Take care,   Increase activity slowly   Complete by: As directed       Allergies as of 09/08/2022       Reactions   Iohexol     Desc: Pt began having sob, with sneezing and immed vomiting post contrast (multihance), Onset Date: [id number]        Medication List     STOP taking these medications    NYQUIL PO       TAKE these medications    dexamethasone 4 MG tablet Commonly known as: DECADRON Take 1 tablet (4 mg total) by mouth daily for 3 days.   EpiPen 2-Pak 0.3 mg/0.3 mL Soaj injection Generic drug: EPINEPHrine Inject 0.3 mg into the muscle as needed.   losartan 50 MG tablet Commonly known as: COZAAR Take 50 mg by mouth daily.   meclizine 25 MG tablet Commonly known as: ANTIVERT Take 1 tablet (25 mg total) by mouth 3 (three) times daily as needed for up to 7 days for dizziness.        Consultations: None  Procedures/Studies:   MR BRAIN/IAC W WO CONTRAST  Result Date: 09/08/2022 CLINICAL DATA:  Vertigo EXAM: MRI HEAD WITHOUT AND WITH CONTRAST TECHNIQUE: Multiplanar, multiecho pulse sequences of the brain and surrounding structures were obtained without and with intravenous contrast. CONTRAST:  10mL GADAVIST GADOBUTROL 1 MMOL/ML IV SOLN COMPARISON:  None Available. FINDINGS: INTERNAL AUDITORY CANALS: There is no cerebellopontine angle mass. The cochleae and semicircular canals are normal. No focal abnormality along the course of the 7th and 8th cranial nerves. Normal porus acusticus and vestibular aqueduct bilaterally. IMPRESSION: Normal MRI of the internal auditory canals. Electronically Signed   By: Deatra Robinson M.D.   On: 09/08/2022 00:00   MR Brain Wo Contrast (neuro  protocol)  Result Date: 09/07/2022 CLINICAL DATA:  Mental status change, unknown cause EXAM: MRI HEAD WITHOUT CONTRAST TECHNIQUE: Multiplanar, multiecho pulse sequences of the brain and surrounding structures were obtained without intravenous contrast. COMPARISON:  Same-day CT head, brain MR 08/17/2008 FINDINGS: Brain: Negative for an acute infarct. No hemorrhage. No hydrocephalus. No extra-axial fluid collection. Small microhemorrhage adjacent to the splenium of the corpus callosum on the right. There is sequela of mild overall chronic microvascular ischemic change. Vascular: Normal flow voids. Skull and upper cervical spine: Normal marrow signal. Sinuses/Orbits: Trace bilateral mastoid effusions. Paranasal sinuses are clear. Orbits are unremarkable. Other: None. IMPRESSION: No acute intracranial process. Electronically Signed  By: Lorenza Cambridge M.D.   On: 09/07/2022 16:19   CT Angio Chest PE W/Cm &/Or Wo Cm  Result Date: 09/07/2022 CLINICAL DATA:  Diaphoresis and shortness of breath. Generalized weakness. Dizziness. Clinical concern for pulmonary embolus. EXAM: CT ANGIOGRAPHY CHEST WITH CONTRAST TECHNIQUE: Multidetector CT imaging of the chest was performed using the standard protocol during bolus administration of intravenous contrast. Multiplanar CT image reconstructions and MIPs were obtained to evaluate the vascular anatomy. RADIATION DOSE REDUCTION: This exam was performed according to the departmental dose-optimization program which includes automated exposure control, adjustment of the mA and/or kV according to patient size and/or use of iterative reconstruction technique. CONTRAST:  75mL OMNIPAQUE IOHEXOL 350 MG/ML SOLN COMPARISON:  01/12/2008.  CT heart study 03/06/2022 FINDINGS: Cardiovascular: The heart size is normal. No substantial pericardial effusion. No thoracic aortic aneurysm. No substantial atherosclerosis of the thoracic aorta. Suboptimal bolus timing without definite evidence for acute  pulmonary embolus. Mediastinum/Nodes: No mediastinal lymphadenopathy. There is no hilar lymphadenopathy. The esophagus has normal imaging features. There is no axillary lymphadenopathy. Lungs/Pleura: Dependent atelectasis noted in the lung bases. No focal airspace consolidation. No pleural effusion. No suspicious pulmonary nodule or mass. Upper Abdomen: The liver shows diffusely decreased attenuation suggesting fat deposition. Musculoskeletal: No worrisome lytic or sclerotic osseous abnormality. Review of the MIP images confirms the above findings. IMPRESSION: 1. Suboptimal bolus timing without definite evidence for acute pulmonary embolus. 2. Dependent atelectasis in the lung bases. 3. Hepatic steatosis. Electronically Signed   By: Kennith Center M.D.   On: 09/07/2022 11:30   CT Head Wo Contrast  Result Date: 09/07/2022 CLINICAL DATA:  Syncope/presyncope with cerebrovascular cause suspected. EXAM: CT HEAD WITHOUT CONTRAST TECHNIQUE: Contiguous axial images were obtained from the base of the skull through the vertex without intravenous contrast. RADIATION DOSE REDUCTION: This exam was performed according to the departmental dose-optimization program which includes automated exposure control, adjustment of the mA and/or kV according to patient size and/or use of iterative reconstruction technique. COMPARISON:  Brain MRI 08/17/2008 FINDINGS: Brain: No evidence of acute infarction, hemorrhage, hydrocephalus, extra-axial collection or mass lesion/mass effect.Mild low-density in the cerebral white matter usually from chronic small vessel disease. Vascular: No hyperdense vessel or unexpected calcification. Skull: Normal. Negative for fracture or focal lesion. Sinuses/Orbits: No acute finding. IMPRESSION: No acute finding. Electronically Signed   By: Tiburcio Pea M.D.   On: 09/07/2022 11:21   DG Chest Portable 1 View  Result Date: 09/07/2022 CLINICAL DATA:  Shortness of breath.  Dizziness and weakness. EXAM:  PORTABLE CHEST 1 VIEW COMPARISON:  09/24/2016 FINDINGS: Heart size is normal accounting for AP position. There is minimal subsegmental atelectasis in the LEFT lung base. Or early infiltrate in the LEFT lung base. No evidence for pulmonary edema. Patient has superior vena cava stent. IMPRESSION: LEFT base atelectasis or early infiltrate.  The Electronically Signed   By: Norva Pavlov M.D.   On: 09/07/2022 06:47       The results of significant diagnostics from this hospitalization (including imaging, microbiology, ancillary and laboratory) are listed below for reference.     Microbiology: No results found for this or any previous visit (from the past 240 hour(s)).   Labs:  CBC: Recent Labs  Lab 09/07/22 0624 09/07/22 1948 09/08/22 0028  WBC 7.8  --  9.1  HGB 14.5 13.9 14.8  HCT 42.3 41.0 43.3  MCV 92.0  --  91.7  PLT 174  --  126*   BMP &GFR Recent Labs  Lab 09/07/22  4098 09/07/22 1921 09/07/22 1948 09/08/22 0028  NA 139 137 141 140  K 3.8 3.5 3.5 3.6  CL 110 108  --  106  CO2 19* 21*  --  21*  GLUCOSE 150* 103*  --  115*  BUN 20 16  --  14  CREATININE 1.10 0.99  --  1.01  CALCIUM 9.2 9.2  --  9.6  MG  --  2.1  --  2.1  PHOS  --  2.4*  --  3.6   Estimated Creatinine Clearance: 122 mL/min (by C-G formula based on SCr of 1.01 mg/dL). Liver & Pancreas: Recent Labs  Lab 09/07/22 0725 09/07/22 1921 09/08/22 0028  AST 19 18 22   ALT 23 25 26   ALKPHOS 49 49 47  BILITOT 0.6 0.6 0.5  PROT 6.0* 6.6 7.1  ALBUMIN 3.8 3.9 4.1   Recent Labs  Lab 09/07/22 0725  LIPASE 45   No results for input(s): "AMMONIA" in the last 168 hours. Diabetic: No results for input(s): "HGBA1C" in the last 72 hours. Recent Labs  Lab 09/07/22 0629  GLUCAP 154*   Cardiac Enzymes: Recent Labs  Lab 09/07/22 1921  CKTOTAL 96   No results for input(s): "PROBNP" in the last 8760 hours. Coagulation Profile: Recent Labs  Lab 09/07/22 0725 09/07/22 1921  INR 1.0 1.0   Thyroid  Function Tests: Recent Labs    09/07/22 1921  TSH 0.540   Lipid Profile: Recent Labs    09/08/22 0028  CHOL 194  HDL 44  LDLCALC 121*  TRIG 146  CHOLHDL 4.4   Anemia Panel: No results for input(s): "VITAMINB12", "FOLATE", "FERRITIN", "TIBC", "IRON", "RETICCTPCT" in the last 72 hours. Urine analysis:    Component Value Date/Time   COLORURINE YELLOW 09/07/2022 1230   APPEARANCEUR CLEAR 09/07/2022 1230   LABSPEC 1.023 09/07/2022 1230   PHURINE 6.0 09/07/2022 1230   GLUCOSEU NEGATIVE 09/07/2022 1230   HGBUR NEGATIVE 09/07/2022 1230   BILIRUBINUR NEGATIVE 09/07/2022 1230   KETONESUR NEGATIVE 09/07/2022 1230   PROTEINUR NEGATIVE 09/07/2022 1230   NITRITE NEGATIVE 09/07/2022 1230   LEUKOCYTESUR NEGATIVE 09/07/2022 1230   Sepsis Labs: Invalid input(s): "PROCALCITONIN", "LACTICIDVEN"   SIGNED:  Almon Hercules, MD  [facility] 09/08/2022, 2:18 PM

## 2022-09-08 NOTE — Progress Notes (Signed)
OT Cancellation Note  Patient Details Name: Cameron Rivas MRN: [id number] DOB: May 22, 1970   Cancelled Treatment:    Reason Eval/Treat Not Completed: OT screened, no needs identified, will sign off  Suzanna Obey 09/08/2022, 8:38 AM 09/08/2022  RP, OTR/L  Acute Rehabilitation Services  Office:  [phone]

## 2022-09-08 NOTE — Evaluation (Signed)
Physical Therapy Vestibular Evaluation and Discharge Patient Details Name: Cameron Rivas MRN: [id number] DOB: [birth year] Today's Date: 09/08/2022  History of Present Illness  52 y.o. male presented with vertigo 09/07/22. MRI brain negative  PMH significant of  testicular CA in 2010  sp chemo/radiation, sleep apnea, obesity, hypertension, history of syncope  Clinical Impression   Patient evaluated by Physical Therapy with no further acute PT needs identified. Patient no longer with vertigo symptoms. See vestibular eval below. Patient's description of severe, constant vertigo with vomiting more indicative of neuritis vs central cause (with MRI brain negative). Expect steroids are helping with inflammation of vestibular nerve. All education has been completed and the patient has no further questions.  PT is signing off. Thank you for this referral.        Recommendations for follow up therapy are one component of a multi-disciplinary discharge planning process, led by the attending physician.  Recommendations may be updated based on patient status, additional functional criteria and insurance authorization.  Follow Up Recommendations       Assistance Recommended at Discharge None  Patient can return home with the following       Equipment Recommendations None recommended by PT  Recommendations for Other Services       Functional Status Assessment Patient has not had a recent decline in their functional status     Precautions / Restrictions Precautions Precautions: None     09/08/22 0001  Vestibular Assessment  General Observation woke up and room spinning. Things were going to the right. Very nauseated +vomited  Symptom Behavior  Type of Dizziness  Spinning  Duration of Dizziness constant  Symptom Nature Constant  Relieving Factors Closing eyes  Progression of Symptoms Better  History of similar episodes periods where he almost passed out but also spinning; stopped when he got  stent put in his neck  Oculomotor Exam  Oculomotor Alignment Normal  Ocular ROM normal  Spontaneous Absent  Gaze-induced  Absent  Smooth Pursuits Intact  Saccades Intact  Comment asymptomatic  Vestibulo-Ocular Reflex  VOR 1 Head Only (x 1 viewing) WNL  Orthostatics  BP sitting 142/98  HR sitting 92  BP standing (after 1 minute) 126/88  HR standing (after 1 minute) 94  BP standing (after 3 minutes) 142/93  HR standing (after 3 minutes) 92  Orthostatics Comment asymptomatic     Mobility  Bed Mobility Overal bed mobility: Independent                  Transfers Overall transfer level: Independent                      Ambulation/Gait Ambulation/Gait assistance: Independent Gait Distance (Feet): 50 Feet Assistive device: None Gait Pattern/deviations: WFL(Within Functional Limits)       General Gait Details: denied dizziness or vertigo  Stairs            Wheelchair Mobility    Modified Rankin (Stroke Patients Only)       Balance Overall balance assessment: Independent                                           Pertinent Vitals/Pain Pain Assessment Pain Assessment: No/denies pain    Home Living Family/patient expects to be discharged to:: Private residence Living Arrangements: Spouse/significant other (3 dogs, chickens) Available Help at Discharge: Family;Available PRN/intermittently (wife works days)  Type of Home: House Home Access: Stairs to enter Entrance Stairs-Rails: Left Entrance Stairs-Number of Steps: 5   Home Layout: One level Home Equipment: Agricultural consultant (2 wheels);Cane - single point;BSC/3in1      Prior Function Prior Level of Function : Independent/Modified Independent;Driving;Working/employed                     Hand Dominance        Extremity/Trunk Assessment   Upper Extremity Assessment Upper Extremity Assessment: Overall WFL for tasks assessed    Lower Extremity  Assessment Lower Extremity Assessment: Overall WFL for tasks assessed    Cervical / Trunk Assessment Cervical / Trunk Assessment: Normal  Communication   Communication: No difficulties  Cognition Arousal/Alertness: Awake/alert Behavior During Therapy: WFL for tasks assessed/performed Overall Cognitive Status: Within Functional Limits for tasks assessed                                          General Comments      Exercises     Assessment/Plan    PT Assessment Patient does not need any further PT services  PT Problem List         PT Treatment Interventions      PT Goals (Current goals can be found in the Care Plan section)  Acute Rehab PT Goals PT Goal Formulation: All assessment and education complete, DC therapy    Frequency       Co-evaluation               AM-PAC PT "6 Clicks" Mobility  Outcome Measure Help needed turning from your back to your side while in a flat bed without using bedrails?: None Help needed moving from lying on your back to sitting on the side of a flat bed without using bedrails?: None Help needed moving to and from a bed to a chair (including a wheelchair)?: None Help needed standing up from a chair using your arms (e.g., wheelchair or bedside chair)?: None Help needed to walk in hospital room?: None Help needed climbing 3-5 steps with a railing? : None 6 Click Score: 24    End of Session   Activity Tolerance: Patient tolerated treatment well Patient left: in chair;with call bell/phone within reach (no alarm needed per RN) Nurse Communication: Mobility status;Other (comment) (orthostatics) PT Visit Diagnosis: Dizziness and giddiness (R42)    Time: 7893-8101 PT Time Calculation (min) (ACUTE ONLY): 34 min   Charges:   PT Evaluation $PT Eval Moderate Complexity: 1 Mod PT Treatments $Self Care/Home Management: 8-22         Jerolyn Center, PT Acute Rehabilitation Services  Office [phone]   Zena Amos 09/08/2022, 8:29 AM

## 2022-09-08 NOTE — Assessment & Plan Note (Signed)
Will replace and recheck in a.m. 

## 2022-09-09 NOTE — Telephone Encounter (Signed)
Patient called stating since he is having some new health issue he was advised to have a SS. NPSG- [employer] no auth req ref # Misty L on 04/30/22   Patient is on the schedule for 09/29/22 at 8 pm.  Mailed new packet to the patient.

## 2022-09-12 NOTE — Telephone Encounter (Signed)
Patient called me back and left me a vmail informing me that he can take the 09/22/22 appt and will be here for that,

## 2022-09-12 NOTE — Telephone Encounter (Signed)
I called the patient and left a voicemail informing him that I needed to r/s his SS that is scheduled for 09/29/22 due to our tech having a family emergency.   I did offer him the 09/22/2022 at 8 pm appointment in the voicemail. I left my direct number to call me back if he can take that appointment.

## 2022-09-22 ENCOUNTER — Ambulatory Visit (INDEPENDENT_AMBULATORY_CARE_PROVIDER_SITE_OTHER): Payer: No Typology Code available for payment source | Admitting: Neurology

## 2022-09-22 DIAGNOSIS — G4700 Insomnia, unspecified: Secondary | ICD-10-CM

## 2022-09-22 DIAGNOSIS — R0683 Snoring: Secondary | ICD-10-CM | POA: Diagnosis not present

## 2022-09-22 DIAGNOSIS — R0681 Apnea, not elsewhere classified: Secondary | ICD-10-CM

## 2022-09-22 DIAGNOSIS — G4730 Sleep apnea, unspecified: Secondary | ICD-10-CM

## 2022-09-22 DIAGNOSIS — E669 Obesity, unspecified: Secondary | ICD-10-CM

## 2022-09-22 DIAGNOSIS — R0689 Other abnormalities of breathing: Secondary | ICD-10-CM

## 2022-09-22 DIAGNOSIS — G4719 Other hypersomnia: Secondary | ICD-10-CM

## 2022-09-22 DIAGNOSIS — G4720 Circadian rhythm sleep disorder, unspecified type: Secondary | ICD-10-CM

## 2022-09-22 DIAGNOSIS — G4733 Obstructive sleep apnea (adult) (pediatric): Secondary | ICD-10-CM

## 2022-09-25 ENCOUNTER — Telehealth: Payer: Self-pay

## 2022-09-25 NOTE — Telephone Encounter (Signed)
I called pt. I advised pt that Dr. Frances Furbish reviewed their sleep study results and found that pt has sever OSA. Dr. Frances Furbish recommends that pt starts pap. I reviewed PAP compliance expectations with the pt. Pt is agreeable to starting a CPAP. I advised pt that an order will be sent to a DME, [facility], and [facility] will call the pt within about one week after they file with the pt's insurance. [facility] will show the pt how to use the machine, fit for masks, and troubleshoot the CPAP if needed. A follow up appt was made for insurance purposes with Amy L 12/18/22 Pt verbalized understanding to arrive 15 minutes early and bring their CPAP. Pt verbalized understanding of results. Pt had no questions at this time but was encouraged to call back if questions arise. I have sent the order to [facility] and have received confirmation that they have received the order.

## 2022-09-25 NOTE — Addendum Note (Signed)
Addended by: Huston Foley on: 09/25/2022 02:10 PM   Modules accepted: Orders

## 2022-09-25 NOTE — Procedures (Signed)
Physician Interpretation:     [facility] SPLIT NIGHT INTERPRETATION REPORT   STUDY DATE: 09/22/2022     PATIENT NAME:  Cameron Rivas         DATE OF BIRTH:  Dec 02, 1970  PATIENT ID:  [id number]    TYPE OF STUDY:  SPLIT  READING PHYSICIAN: Huston Foley, MD, PhD SCORING TECHNICIAN: Margaretann Loveless, RPSGT   Referred by: Creola Corn, MD  ? History and Indication for Testing: 52 year old male with an underlying medical history of hypertension, testicular cancer, status post surgery in 2004, status post chemotherapy, history of syncope, and obesity, who reports snoring and excessive daytime somnolence and difficulty losing weight, as well as a longer standing Hx of difficulty maintaining sleep (for years). His Epworth sleepiness score is 9 out of 24, fatigue severity score is 45 out of 63. Height: 73.0 in Weight: 295 lb (BMI 38) Neck Size: 19.0 in.    Medications: Epinephrine, Cozaar, Nyquil  DESCRIPTION: A sleep technologist was in attendance for the duration of the recording.  Data collection, scoring, video monitoring, and reporting were performed in compliance with the AASM Manual for the Scoring of Sleep and Associated Events; (Hypopnea is scored based on the criteria listed in Section VIII D. 1b in the AASM Manual V2.6 using a 4% oxygen desaturation rule or Hypopnea is scored based on the criteria listed in Section VIII D. 1a in the AASM Manual V2.6 using 3% oxygen desaturation and /or arousal rule).  A physician certified by the American Board of Sleep Medicine reviewed each epoch of the study.   FINDINGS:  Please refer to the attached summary for additional quantitative information.  STUDY DETAILS: Lights off was at 20:45: and lights on 05:06: (501 minutes hours in bed). This study was performed with an initial diagnostic portion followed by positive airway pressure titration.  DIAGNOSTIC ANALYSIS   SLEEP CONTINUITY AND SLEEP ARCHITECTURE:  The diagnostic  portion of the study began at 20:45 and ended at 00:47, for a recording time of 4h 1.3m minutes.  Total sleep time was 151 minutes minutes (6.0% supine;  94.0% lateral;  0.0% prone, 19.5% REM sleep), with a decreased sleep efficiency at 62.5%. Sleep latency was increased at 38.0 minutes. REM sleep latency was increased at 149.5 minutes.  Arousal index was 38.9 /hr. Of the total sleep time, the percentage of stage N1 sleep was 16.6%, stage N2 sleep was 61.3%, both increased, stage N3 sleep was 2.6%, and REM sleep was 19.5%. Wake after sleep onset (WASO) time accounted for 52 minutes with moderate sleep fragmentation noted.     AROUSAL (Baseline): There were 84.0 arousals in total, for an arousal index of 33.4 arousals/hour.  Of these, 82.0 were identified as respiratory-related arousals (32.6 /hr), 0 were PLM-related arousals (0.0 /hr), and 17 were non-specific arousals (6.8 /hr)     RESPIRATORY MONITORING:  Based on CMS criteria (using a 4% oxygen desaturation rule for scoring hypopneas), there were 49 apneas (49 obstructive; 0 central; 0 mixed), and 141 hypopneas.  Apnea index was 19.1. Hypopnea index was 51.3. The apnea-hypopnea index was 70.3 overall (2.8 supine; 71.2 REM, 0.0 supine REM). There were 0 respiratory effort-related arousals (RERAs).  The RERA index was 0.0 events/hr. Total respiratory disturbance index (RDI) was 70.3 events/hr. RDI results showed: supine RDI  46.7 /hr; non-supine RDI 71.8 /hr; REM RDI 71.2 /hr, supine REM RDI 0.0 /hr.  Based on AASM criteria (using a 3% oxygen desaturation and /or arousal rule for scoring  hypopneas), there were 49 apneas (49 obstructive; 0 central; 0 mixed), and 141 hypopneas.  Apnea index was 19.1. Hypopnea index was 58.0. The apnea-hypopnea index was 77.1/hour overall (3.2 supine; 75.3 REM, 0.0 supine REM). There were 0 respiratory effort-related arousals (RERAs). Total respiratory disturbance index (RDI) was 77.1 events/hr. RDI results showed: supine RDI  53.3 /hr; non-supine RDI 78.6 /hr; REM RDI 75.3 /hr, supine REM RDI 0.0 /hr.   Respiratory events were associated with oxyhemoglobin desaturations (nadir 83%) from a normal baseline (mean 95%). Total time spent at, or below 88% was 12.6 minutes, or 8.3%  of total sleep time. Snoring was absent. There were 0.0 occurrences of Cheyne Stokes breathing.     LIMB MOVEMENTS: There were 0 periodic limb movements of sleep (0.0/hr), of which 0 (0.0/hr) were associated with an arousal.     OXIMETRY: Total sleep time spent at, or below 88% was 10.9 minutes, or 7.2% of total sleep time.      BODY POSITION: Duration of total sleep and percent of total sleep in their respective position is as follows: supine 09 minutes minutes (6.0%), non-supine 142.0 minutes (94.0%); right 107 minutes minutes (71.2%), left 34 minutes minutes (22.8%), and prone 00 minutes minutes (0.0%). Total supine REM sleep time was 00 minutes minutes (0.0% of total REM sleep).   Analysis of electrocardiogram activity showed the highest heart rate for the baseline portion of the study was 104.0 beats per minute.  The average heart rate during sleep was 79 bpm, while the highest heart rate for the same period was 86 bpm.    TREATMENT ANALYSIS SLEEP CONTINUITY AND SLEEP ARCHITECTURE:  The treatment portion of the study began at 00:47 and ended at 05:06, for a recording time of 4h 20.37m minutes.  Total sleep time was 212 minutes minutes (32.0% supine;  68.0% lateral; 0.0% prone, 35.5% REM sleep), with a decreased sleep efficiency at 81.7%. Sleep latency was normal at 15.5 minutes. REM sleep latency was decreased at 43.0 minutes.  Arousal index was 11.9 /hr. Of the total sleep time, the percentage of stage N1 sleep was 5.9%, stage N3 sleep was 12.9%, and REM sleep was 35.5%, in keeping with rebound. Wake after sleep onset (WASO) time accounted for 32 minutes with mild sleep fragmentation noted.     AROUSAL: There were 35.0 arousals in total, for  an arousal index of 9.9 arousals/hour.  Of these, 15.0 were identified as respiratory-related arousals (4.2 /hr), 0 were PLM-related arousals (0.0 /hr), and 27 were non-specific arousals (7.6 /hr)    RESPIRATORY MONITORING:    While on PAP therapy, based on CMS criteria, the apnea-hypopnea index was 3.7 overall (2.3 supine; 0.3 REM).   While on PAP therapy, based on AASM criteria, the apnea-hypopnea index was 11.9 overall (7.3 supine; 2.3 REM).   Respiratory events were associated with oxyhemoglobin desaturation (nadir 87.0%) from a mean of 95.0%.  Total time spent at, or below 88% was 0.4 minutes, or 0.2%  of total sleep time.      LIMB MOVEMENTS: There were 0 periodic limb movements of sleep (0.0/hr), of which 0 (0.0/hr) were associated with an arousal.     OXIMETRY: Total sleep time spent at, or below 88% was 0.3 minutes, or 0.1% of total sleep time. Snoring was classified as .     BODY POSITION: Duration of total sleep and percent of total sleep in their respective position is as follows: supine 68 minutes minutes (32.0%), non-supine 144.5 minutes (68.0%); right 122 minutes minutes (  57.4%), left 22 minutes minutes (10.6%), and prone 00 minutes minutes (0.0%). Total supine REM sleep time was 17 minutes minutes (22.5% of total REM sleep).   Analysis of electrocardiogram activity showed the highest heart rate for the treatment portion of the study was 93.0 beats per minute.  The average heart rate during sleep was 73 bpm, while the highest heart rate for the same period was 90 bpm.    EEG: Review of the EEG showed no abnormal electrical discharges and symmetrical bihemispheric findings.    TITRATION DETAILS (SEE ALSO TABLE AT THE END OF THE REPORT):  The patient qualified for an emergency split sleep study per AASM standards. The baseline AHI was 77.1/h, and O2 nadir 83%. The patient was shown several different interfaces and was subsequently fitted with a SM FFM mask from [facility]).   The patient was started on a pressure of 5 cm of water pressure and gradually titrated to a final titration pressure of 12 cm. The AHI was improved and optimized at a pressure of 12 cm, on which the patient achieved a total sleep time of  129 minutes.  Residual AHI was 0.47/hour, O2 nadir of 93%, with non-supine REM sleep achieved.     EKG: The EKG revealed normal sinus rhythm (NSR). The average heart rate during sleep was 79 bpm prior to PAP therapy and 73 bpm on PAP therapy.    AUDIO/VIDEO REVIEW: The audio and video review did not show any abnormal or unusual behaviors, movements, phonations or vocalizations. The patient took no restroom breaks. Snoring was noted, in the moderate to loud range prior to PAP start and alleviated with PAP therapy. Patient took melatonin prior to study start.     POST-STUDY QUESTIONNAIRE: Post study, the patient indicated, that sleep was better than usual.     IMPRESSION:    1. Severe Obstructive Sleep Apnea (OSA) 2. Dysfunctions associated with sleep stages or arousal from sleep    RECOMMENDATIONS:    1. This patient has severe obstructive sleep apnea and responded well to PAP therapy. The patient qualified for an emergency split sleep study per AASM standards. The baseline AHI was 77.1/h, and O2 nadir 83%.  The patient responded well to CPAP treatment at a pressure of 12 cm with EPR of 2. I recommend starting patient on home CPAP therapy at a pressure of 12 cm via FFM with heated humidity (or mask of choice, sized to fit, EPR as per tolerance). The patient will be advised to be fully compliant with PAP therapy to improve sleep related symptoms and decrease long term cardiovascular risks. Please note, that untreated obstructive sleep apnea may carry additional perioperative morbidity. Patients with significant obstructive sleep apnea should receive perioperative PAP therapy and the surgeons and particularly the anesthesiologist should be informed of the diagnosis  and the severity of the sleep disordered breathing. 2. This study shows sleep fragmentation and abnormal sleep stage percentages; these are nonspecific findings and per se do not signify an intrinsic sleep disorder or a cause for the patient's sleep-related symptoms. Causes include (but are not limited to) the first night effect of the sleep study, circadian rhythm disturbances, medication effect or an underlying mood disorder or medical problem.  3. The patient should be cautioned not to drive, work at heights, or operate dangerous or heavy equipment when tired or sleepy. Review and reiteration of good sleep hygiene measures should be pursued with any patient. 4. The patient will be seen in follow-up in the sleep  clinic at [facility] for discussion of the test results, symptom and treatment compliance review, further management strategies, etc. The referring provider will be notified of the test results.   I certify that I have reviewed the entire raw data recording prior to the issuance of this report in accordance with the Standards of Accreditation of the American Academy of Sleep Medicine (AASM).  Huston Foley, MD, PhD Medical Director, [facility]) Diplomat, ABPN (Neurology and Sleep)           Technical Report:   [facility] Split Summary    General Information  Name: Cameron Rivas, Cameron Rivas BMI: 16.10 Physician: Huston Foley, MD  ID: [id number] Height: 73.0 in Technician: Margaretann Loveless, RPSGT  Sex: Male Weight: 295.0 lb Record: [id number]  Age: 31 [August 19, 1970] Date: 09/22/2022     Medical & Medication History    52 year old male with an underlying medical history of hypertension, testicular cancer, status post surgery in 2004, status post chemotherapy, history of syncope, and obesity, who reports snoring and excessive daytime somnolence and difficulty losing weight, as well as a longer standing Hx of difficulty  maintaining sleep (for years). He has trouble going to sleep and maintaining sleep and takes ZzQuil nightly. His wife has noticed apneic pauses while he is asleep and he has woken himself up with a sense of gasping for air. He denies recurrent nocturnal or morning headaches or night to night nocturia, not aware of any family history of sleep apnea, he believes he had a tonsillectomy as a child and also ear tubes. Epinephrine, Cozaar, Nyquil   Sleep Disorder      Comments   The patient came into the sleep lab for a PSG. The patient took Melatonin prior to start of sleep study. The patient was split due to AHI being greater than 40 after 2 hours of TST. The patient was fitted with an F&P Evora (FFM) size S-M. The mask was a good fit to face, and the patient tolerated the mask well. CPAP was started at [facility] with no EPR. CPAP was titrated up to 12cmH2O with EPR of 2 due to respiratory events and snoring. No restroom breaks. EKG kept in NSR. Moderate to loud snoring prior to CPAP placement. Respiratory events scored with a 3% desat. The patient slept lateral and supine. All sleep stages observed. AHI was 80 after 2 hrs of TST.    Baseline Sleep Stage Information Baseline start time: 08:45:19 PM Baseline end time: 12:47:00 AM   Time Total Supine Side Prone Upright  Recording 4h 1.40m 0h 51.46m 3h 10.28m 0h 0.22m 0h 0.40m  Sleep 2h 31.26m 0h 9.66m 2h 22.71m 0h 0.93m 0h 0.74m   Latency N1 N2 N3 REM Onset Per. Slp. Eff.  Actual 0h 0.10m 0h 4.85m 1h 52.22m 2h 29.40m 0h 38.59m 0h 59.107m 62.53%   Stg Dur Wake N1 N2 N3 REM  Total 90.5 25.0 92.5 4.0 29.5  Supine 42.5 6.5 2.5 0.0 0.0  Side 48.0 18.5 90.0 4.0 29.5  Prone 0.0 0.0 0.0 0.0 0.0  Upright 0.0 0.0 0.0 0.0 0.0   Stg % Wake N1 N2 N3 REM  Total 37.5 16.6 61.3 2.6 19.5  Supine 17.6 4.3 1.7 0.0 0.0  Side 19.9 12.3 59.6 2.6 19.5  Prone 0.0 0.0 0.0 0.0 0.0  Upright 0.0 0.0 0.0 0.0 0.0    CPAP Sleep Stage Information CPAP start time: 12:47:00 AM CPAP end time:  05:06:37 AM   Time Total Supine  Side Prone Upright  Recording (TRT) 4h 20.81m 1h 29.88m 2h 51.32m 0h 0.58m 0h 0.17m  Sleep (TST) 3h 32.32m 1h 8.50m 2h 24.66m 0h 0.10m 0h 0.75m   Latency N1 N2 N3 REM Onset Per. Slp. Eff.  Actual 0h 0.74m 0h 3.65m 1h 35.47m 0h 43.37m 0h 15.67m 0h 28.69m 81.73%   Stg Dur Wake N1 N2 N3 REM  Total 47.5 12.5 97.0 27.5 75.5  Supine 21.0 7.5 41.5 2.0 17.0  Side 26.5 5.0 55.5 25.5 58.5  Prone 0.0 0.0 0.0 0.0 0.0  Upright 0.0 0.0 0.0 0.0 0.0   Stg % Wake N1 N2 N3 REM  Total 18.3 5.9 45.6 12.9 35.5  Supine 8.1 3.5 19.5 0.9 8.0  Side 10.2 2.4 26.1 12.0 27.5  Prone 0.0 0.0 0.0 0.0 0.0  Upright 0.0 0.0 0.0 0.0 0.0    Baseline Respiratory Information Apnea Summary Sub Supine Side Prone Upright  Total 48 Total 48 4 44 0 0    REM 13 0 13 0 0    NREM 35 4 31 0 0  Obs 48 REM 13 0 13 0 0    NREM 35 4 31 0 0  Mix 0 REM 0 0 0 0 0    NREM 0 0 0 0 0  Cen 0 REM 0 0 0 0 0    NREM 0 0 0 0 0   Rera Summary Sub Supine Side Prone Upright  Total 0 Total 0 0 0 0 0    REM 0 0 0 0 0    NREM 0 0 0 0 0   Hypopnea Summary Sub Supine Side Prone Upright  Total 146 Total 146 4 142 0 0    REM 24 0 24 0 0    NREM 122 4 118 0 0   4% Hypopnea Summary Sub Supine Side Prone Upright  Total (4%) 129 Total 129 3 126 0 0    REM 22 0 22 0 0    NREM 107 3 104 0 0     AHI Total Obs Mix Cen  77.09 Apnea 19.07 19.07 0.00 0.00   Hypopnea 58.01 -- -- --  70.33 Hypopnea (4%) 51.26 -- -- --    Total Supine Side Prone Upright  Position AHI 77.09 53.33 78.59 0.00 0.00  REM AHI 75.25   NREM AHI 77.53   Position RDI 77.09 53.33 78.59 0.00 0.00  REM RDI 75.25   NREM RDI 77.53    4% Hypopnea Total Supine Side Prone Upright  Position AHI (4%) 70.33 46.67 71.83 0.00 0.00  REM AHI (4%) 71.19   NREM AHI (4%) 70.12   Position RDI (4%) 70.33 46.67 71.83 0.00 0.00  REM RDI (4%) 71.19   NREM RDI (4%) 70.12    CPAP Respiratory Information Apnea Summary Sub Supine Side Prone Upright  Total 1 Total 1 0  1 0 0    REM 0 0 0 0 0    NREM 1 0 1 0 0  Obs 1 REM 0 0 0 0 0    NREM 1 0 1 0 0  Mix 0 REM 0 0 0 0 0    NREM 0 0 0 0 0  Cen 0 REM 0 0 0 0 0    NREM 0 0 0 0 0   Rera Summary Sub Supine Side Prone Upright  Total 0 Total 0 0 0 0 0    REM 0 0 0 0 0    NREM 0 0  0 0 0   Hypopnea Summary Sub Supine Side Prone Upright  Total 41 Total 41 26 15 0 0    REM 8 7 1  0 0    NREM 33 19 14 0 0   4% Hypopnea Summary Sub Supine Side Prone Upright  Total (4%) 12 Total 12 8 4  0 0    REM 1 1 0 0 0    NREM 11 7 4  0 0     AHI Total Obs Mix Cen  11.86 Apnea 0.28 0.28 0.00 0.00   Hypopnea 11.58 -- -- --  3.67 Hypopnea (4%) 3.39 -- -- --    Total Supine Side Prone Upright  Position AHI 11.86 22.94 6.64 0.00 0.00  REM AHI 6.36   NREM AHI 14.89   Position RDI 11.86 22.94 6.64 0.00 0.00  REM RDI 6.36   NREM RDI 14.89    4% Hypopnea Total Supine Side Prone Upright  Position AHI (4%) 3.67 7.06 2.08 0.00 0.00  REM AHI (4%) 0.79   NREM AHI (4%) 5.26   Position RDI (4%) 3.67 7.06 2.08 0.00 0.00  REM RDI (4%) 0.79   NREM RDI (4%) 5.26    Desaturation Information (Baseline)  <100% <90% <80% <70% <60% <50% <40%  Supine 16 6 0 0 0 0 0  Side 180 101 0 0 0 0 0  Prone 0 0 0 0 0 0 0  Upright 0 0 0 0 0 0 0  Total 196 107 0 0 0 0 0  Desaturation threshold setting: 3% Minimum desaturation setting: 10 seconds SaO2 nadir: 83% The longest event was a 69 sec obstructive Hypopneawith a minimum SaO2 of 87%. The lowest SaO2 was 83% associated with a 49 sec obstructive Hypopnea. Awakening/Arousal Information (Baseline) # of Awakenings 24  Wake after sleep onset 52.38m  Wake after persistent sleep 40.66m   Arousal Assoc. Arousals Index  Apneas 29 11.5  Hypopneas 53 21.1  Leg Movements 0 0.0  Snore 0.0 0.0  PTT Arousals 0 0.0  Spontaneous 17 6.8  Total 98 38.9   Desaturation Information (CPAP)  <100% <90% <80% <70% <60% <50% <40%  Supine 31 2 0 0 0 0 0  Side 18 1 0 0 0 0 0  Prone 0 0 0 0 0 0 0   Upright 0 0 0 0 0 0 0  Total 49 3 0 0 0 0 0  Desaturation threshold setting: 3% Minimum desaturation setting: 10 seconds SaO2 nadir: 87% The longest event was a 47 sec obstructive Hypopnea with a minimum SaO2 of 92%. The lowest SaO2 was 87% associated with a 27 sec obstructive Hypopnea. Awakening/Arousal Information (CPAP) # of Awakenings 16  Wake after sleep onset 32.50m  Wake after persistent sleep 31.27m   Arousal Assoc. Arousals Index  Apneas 0 0.0  Hypopneas 15 4.2  Leg Movements 0 0.0  Snore 0.0 0.0  PTT Arousals 0 0.0  Spontaneous 28 7.9  Total 43 12.1     EKG Rates (Baseline) EKG Avg Max Min  Awake 88 109 74  Asleep 79 86 71  EKG Events: Tachycardia Myoclonus Information (Baseline) PLMS LMs Index  Total LMs during PLMS 0 0.0  LMs w/ Microarousals 0 0.0   LM LMs Index  w/ Microarousal 0 0.0  w/ Awakening 0 0.0  w/ Resp Event 0 0.0  Spontaneous 0 0.0  Total 0 0.0   EKG Rates (CPAP) EKG Avg Max Min  Awake 76 93 66  Asleep 73 90  65  EKG Events: Tachycardia Myoclonus Information (CPAP) PLMS LMs Index  Total LMs during PLMS 0 0.0  LMs w/ Microarousals 0 0.0   LM LMs Index  w/ Microarousal 0 0.0  w/ Awakening 1 0.3  w/ Resp Event 0 0.0  Spontaneous 3 0.8  Total 3 0.8      Titration Table:  [facility] CPAP/Bilevel Report    General Information  Name: Cameron Rivas, Cameron Rivas BMI: 38 Physician: Huston Foley   ID: [id number] Height: 73 in Technician: Margaretann Loveless  Sex: Male Weight: 295 lb Record: [id number]  Age: 18 [Jul 22, 1970] Date: 09/22/2022 Scorer: Zenon Mayo Fields   Recommended Settings IPAP: N/A cmH20 EPAP: N/A cmH2O AHI: N/A AHI (4%): N/A   Pressure IPAP/EPAP 00 05 07 09 10 11 12    O2 Vol 0.0 0.0 0.0 0.0 0.0 0.0 0.0  Time TRT 241.42m 21.79m 33.86m 13.83m 34.87m 20.15m 137.13m   TST 151.32m 5.60m 24.42m 13.71m 21.35m 20.41m 129.75m  Sleep Stage % Wake 37.5 74.4 28.4 0.0 39.1 0.0 6.2   % REM 19.5 0.0 0.0 73.1 19.0 0.0 48.1    % N1 16.6 45.5 22.9 0.0 9.5 0.0 1.9   % N2 61.3 54.5 77.1 26.9 71.4 90.0 30.2   % N3 2.6 0.0 0.0 0.0 0.0 10.0 19.8  Respiratory Total Events 194 7 15 6 5 8 1    Obs. Apn. 48 1 0 0 0 0 0   Mixed Apn. 0 0 0 0 0 0 0   Cen. Apn. 0 0 0 0 0 0 0   Hypopneas 146 6 15 6 5 8 1    AHI 77.09 76.36 37.50 27.69 14.29 24.00 0.47   Supine AHI 53.33 0.00 60.00 27.69 14.29 24.00 0.00   Prone AHI 0.00 0.00 0.00 0.00 0.00 0.00 0.00   Side AHI 78.59 76.36 28.24 0.00 0.00 0.00 0.49  Respiratory (4%) Hypopneas (4%) 129.00 3.00 3.00 2.00 0.00 4.00 0.00   AHI (4%) 70.33 43.64 7.50 9.23 0.00 12.00 0.00   Supine AHI (4%) 46.67 0.00 17.14 9.23 0.00 12.00 0.00   Prone AHI (4%) 0.00 0.00 0.00 0.00 0.00 0.00 0.00   Side AHI (4%) 71.83 43.64 3.53 0.00 0.00 0.00 0.00  Desat Profile <= 90% 43.38m 0.72m 0.67m 0.60m 0.47m 0.9m 0.84m   <= 80% 0.30m 0.32m 0.66m 0.83m 0.56m 0.60m 0.48m   <= 70% 0.6m 0.46m 0.12m 0.67m 0.31m 0.30m 0.43m   <= 60% 0.6m 0.35m 0.77m 0.39m 0.56m 0.58m 0.39m  Arousal Index Apnea 11.5 0.0 0.0 0.0 0.0 0.0 0.0   Hypopnea 21.1 32.7 17.5 13.8 2.9 3.0 0.0   LM 0.0 0.0 0.0 0.0 0.0 0.0 0.0   Spontaneous 6.8 21.8 12.5 9.2 14.3 6.0 5.6

## 2022-09-25 NOTE — Telephone Encounter (Signed)
-----   Message from Huston Foley, MD sent at 09/25/2022  2:10 PM EDT ----- Urgent set up requested on PAP therapy, due to severe OSA. Patient referred by PCP, seen by me on 04/04/22, patient had a split night sleep study on 09/22/22. Please call and notify patient that the recent sleep study showed severe obstructive sleep apnea (OSA). He did well with CPAP during the study with great improvement of the respiratory events. I would like start the patient on a CPAP machine for home use. I placed the order in the chart.  Please advise patient that we will need a follow up appointment with either myself or one of our nurse practitioners in about 2-3 months post set-up to check for how they are doing on treatment and how well it's going with the machine in general. Most insurance company require a certain compliance percentage to continue to cover/pay for the machine. Please ask patient to schedule this FU appointment, according to the set-up date, which is the day they receive the machine. Please make sure, the patient understands the importance of keeping this window for the FU appointment, as it is often an Barista and not our rule. Failing to adhere to this may result in losing coverage for sleep apnea treatment, at which point some insurances require repeating the whole process. Plus, monitoring compliance data is usually good feedback for the patient as far as how they are doing, how many hours they are on it, how well the mask fits, etc.  Also remind patient, that any PAP machine or mask issues should be first addressed with the DME company, who provided the machine/supplies.  Please ask if patient has a preference regarding DME company, may depend on the insurance too.  Huston Foley, MD, PhD [facility])

## 2022-09-25 NOTE — Telephone Encounter (Signed)
Zott, Stacy  Lyniah Fujita, Abbe Amsterdam, CMA; Zott, Ermalinda Memos, Tammy Got It thank you       Previous Messages    ----- Message ----- From: Bobbye Morton, CMA Sent: 09/25/2022   3:38 PM EDT To: Marlou Porch Zott Subject: URGENT PAP                                    New orders have been placed for the above pt, DOB: 2070/04/18 Thanks

## 2022-10-01 NOTE — Telephone Encounter (Signed)
Spoke to patient Pt states insurance is not helping with co pay due to deductible. Pt states not able to lease pap machine due to credit Called [employer] [facility] spoke to [place] . Per Efraim Kaufmann pt can apply for  for a hardship program to help with monthly payments . Informed patient that will change DME to [employer] [facility] . Pt was for appreciative for our help . Pt will bring insurance card tomorrow to be scanned  in so I can fax orders to [employer] [facility] tomorrow . Pt thanked me for calling

## 2022-10-01 NOTE — Telephone Encounter (Signed)
Received fax from [facility] stating "patient has declined to move forward with set up at this time due to patient's out-of-pocket cost.  Patient is aware they can give [place] a call if they decide to move forward" I contacted pt to discuss, no answer. LVM rq call back.

## 2022-10-07 NOTE — Telephone Encounter (Signed)
Faxed over orders today to [employer] [facility]

## 2022-11-28 ENCOUNTER — Telehealth: Payer: Self-pay | Admitting: Neurology

## 2022-11-28 NOTE — Telephone Encounter (Signed)
REQUIRED PHONE NOTE:pt called to r/s his initial CPAP f/u

## 2022-12-05 NOTE — Progress Notes (Deleted)
[facility] [address] [place]. [place] [address] [phone]       OFFICE FOLLOW UP NOTE  [name] Date of Birth:  [birth year] Medical Record Number:  [id number]    Primary neurologist: Dr. Frances Furbish Reason for visit: Initial CPAP follow-up    SUBJECTIVE:   CHIEF COMPLAINT:  No chief complaint on file.   Follow-up visit:  Prior visit:  Brief HPI:   Cameron Rivas is a 52 y.o. male who was initially seen by Dr. Frances Furbish on 04/04/2022 for concern of underlying sleep apnea with complaints of snoring, excessive daytime somnolence, difficulty losing weight, and longstanding history of maintaining sleep.  ESS 9/24. FSS 45/63.  Sleep study 09/22/2022 showed severe sleep apnea, qualify for emergency split sleep study with baseline AHI 77.1/h and O2 nadir of 83%, responded well to CPAP treatment at pressure of 12 and EPR level 2.  CPAP initiated 10/16/2022.  DME [facility]    Interval history:  Compliance report shows excellent usage and optimal residual AHI.  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)    PSH:  Past Surgical History:  Procedure Laterality Date   CARDIAC CATHETERIZATION     2010 or 2011   testicle Left    removed 2004   WISDOM TOOTH EXTRACTION     1993    Social History:  Social History   Socioeconomic History   Marital status: Married    Spouse name: Not on file   Number of children: Not on file   Years of education: Not on file   Highest education level: Not on file  Occupational History   Not on file  Tobacco Use   Smoking status: Never   Smokeless tobacco: Never  Vaping Use   Vaping status: Never Used  Substance and Sexual Activity    Alcohol use: Yes    Comment: 1 beer or 2 shot at night nightly   Drug use: Never   Sexual activity: Not on file  Other Topics Concern   Not on file  Social History Narrative   Caffiene coffee 12 cups daily, 5-6 cups unsweetened tea.    Work Systems developer, Counselling psychologist   Social Determinants of Corporate investment banker Strain: Not on file  Food Insecurity: Not on file  Transportation Needs: Not on file  Physical Activity: Not on file  Stress: Not on file  Social Connections: Not on file  Intimate Partner Violence: Not on file    Family History:  Family History  Problem Relation Age of Onset   Asthma Mother    Diabetes Mellitus I Mother    Pancreatic cancer Mother    Kidney failure Mother        dialysis   Throat cancer Father    Diabetes Mellitus I Maternal Grandmother    Alzheimer's disease Maternal Grandmother    Heart disease Maternal Grandfather     Medications:   Current Outpatient Medications on File Prior to Visit  Medication Sig Dispense Refill   EPINEPHrine (EPIPEN 2-PAK) 0.3 mg/0.3 mL IJ SOAJ injection Inject 0.3 mg into the muscle as needed.  losartan (COZAAR) 50 MG tablet Take 50 mg by mouth daily.     No current facility-administered medications on file prior to visit.    Allergies:   Allergies  Allergen Reactions   Iohexol      Desc: Pt began having sob, with sneezing and immed vomiting post contrast (multihance), Onset Date: [id number]       OBJECTIVE:  Physical Exam  There were no vitals filed for this visit. There is no height or weight on file to calculate BMI. No results found.   General: well developed, well nourished, seated, in no evident distress Head: head normocephalic and atraumatic.   Neck: supple with no carotid or supraclavicular bruits Cardiovascular: regular rate and rhythm, no murmurs Musculoskeletal: no deformity Skin:  no rash/petichiae Vascular:  Normal pulses all extremities   Neurologic Exam Mental  Status: Awake and fully alert. Oriented to place and time. Recent and remote memory intact. Attention span, concentration and fund of knowledge appropriate. Mood and affect appropriate.  Cranial Nerves: Pupils equal, briskly reactive to light. Extraocular movements full without nystagmus. Visual fields full to confrontation. Hearing intact. Facial sensation intact. Face, tongue, palate moves normally and symmetrically.  Motor: Normal bulk and tone. Normal strength in all tested extremity muscles Sensory.: intact to touch , pinprick , position and vibratory sensation.  Coordination: Rapid alternating movements normal in all extremities. Finger-to-nose and heel-to-shin performed accurately bilaterally. Gait and Station: Arises from chair without difficulty. Stance is normal. Gait demonstrates normal stride length and balance without use of AD. Tandem walk and heel toe without difficulty.  Reflexes: 1+ and symmetric. Toes downgoing.         ASSESSMENT/PLAN: CHOYA Rivas is a 52 y.o. year old male    OSA on CPAP : Compliance report shows satisfactory usage with optimal residual AHI.  Discussed continued nightly usage with ensuring greater than 4 hours nightly for optimal benefit and per insurance purposes.  Continue to follow with DME company for any needed supplies or CPAP related concerns     Follow up in *** or call earlier if needed   CC:  PCP: Creola Corn, MD    I spent *** minutes of face-to-face and non-face-to-face time with patient.  This included previsit chart review, lab review, study review, order entry, electronic health record documentation, patient education and discussion regarding above diagnoses and treatment plan and answered all other questions to patient's satisfaction    Ihor Austin, [facility]  [facility] [address] [place], [place] [address]  Phone [phone] Fax [phone] Note: This document was prepared with  digital dictation and possible smart phrase technology. Any transcriptional errors that result from this process are unintentional.

## 2022-12-09 ENCOUNTER — Encounter: Payer: No Typology Code available for payment source | Admitting: Adult Health

## 2022-12-09 ENCOUNTER — Encounter: Payer: Self-pay | Admitting: Adult Health

## 2022-12-10 DIAGNOSIS — Z0289 Encounter for other administrative examinations: Secondary | ICD-10-CM

## 2022-12-12 ENCOUNTER — Telehealth: Payer: Self-pay

## 2022-12-12 ENCOUNTER — Encounter: Payer: Self-pay | Admitting: Adult Health

## 2022-12-12 ENCOUNTER — Ambulatory Visit: Payer: No Typology Code available for payment source | Admitting: Adult Health

## 2022-12-12 VITALS — BP 141/85 | HR 74 | Ht 73.0 in | Wt 308.0 lb

## 2022-12-12 DIAGNOSIS — G4733 Obstructive sleep apnea (adult) (pediatric): Secondary | ICD-10-CM | POA: Diagnosis not present

## 2022-12-12 NOTE — Progress Notes (Signed)
[facility] [address] [place]. [place] [address] [phone]       OFFICE FOLLOW UP NOTE  Cameron Rivas Date of Birth:  03-13-1971 Medical Record Number:  [id number]    Primary neurologist: Dr. Frances Furbish Reason for visit: Initial CPAP follow-up    SUBJECTIVE:   CHIEF COMPLAINT:  Chief Complaint  Patient presents with   Follow-up    Patient in room #3 and alone.  Patient states he sleep better at night but wakes up with dry mouth and his right nosler is sore while wearing his mask.    Follow-up visit:   Brief HPI:   Cameron Rivas is a 52 y.o. male who was initially seen by Dr. Frances Furbish on 04/04/2022 for concern of underlying sleep apnea with complaints of snoring, excessive daytime somnolence, difficulty losing weight, and longstanding history of maintaining sleep.  ESS 9/24. FSS 45/63.  Sleep study 09/22/2022 showed severe sleep apnea, qualify for emergency split sleep study with baseline AHI 77.1/h and O2 nadir of 83%, responded well to CPAP treatment at pressure of 12 and EPR level 2.  CPAP initiated 10/16/2022.  DME [facility]    Interval history:  Compliance report shows excellent usage and optimal residual AHI. Reports some difficulty tolerating, use of [employer] Evora full face mask, will slide up in nose causing irritation, mouth frequently drops below mask, will cause him to wake up at night due to leaks.  Difficulty tolerating pressure at times, will wake up with dry mouth. Tried to adjust humidity level but ended up with water in tubing and mask. He does believe sleep has improved some, slight improvement of daytime energy levels but still stay sleepy. ESS 8/24 (prior to CPAP 9/24). FSS 38/63 (prior to CPAP 45/63).        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)    PSH:  Past Surgical History:  Procedure Laterality Date   CARDIAC CATHETERIZATION     2010 or 2011   testicle Left    removed 2004   WISDOM TOOTH EXTRACTION     1993    Social History:  Social History   Socioeconomic History   Marital status: Married    Spouse name: Not on file   Number of children: Not on file   Years of education: Not on file   Highest education level: Not on file  Occupational History   Not on file  Tobacco Use   Smoking status: Never   Smokeless tobacco: Never  Vaping Use   Vaping status: Never Used  Substance and Sexual Activity   Alcohol use: Yes    Comment: 1 beer or 2 shot at night nightly   Drug use: Never   Sexual activity: Not on file  Other Topics Concern   Not on file  Social History Narrative   Caffiene coffee 12 cups daily, 5-6 cups unsweetened tea.    Work Systems developer, Counselling psychologist   Social Determinants of Corporate investment banker Strain: Not on file  Food Insecurity: Not on file  Transportation Needs: Not on file  Physical Activity: Not on file  Stress: Not on file  Social Connections: Not on  file  Intimate Partner Violence: Not on file    Family History:  Family History  Problem Relation Age of Onset   Asthma Mother    Diabetes Mellitus I Mother    Pancreatic cancer Mother    Kidney failure Mother        dialysis   Throat cancer Father    Diabetes Mellitus I Maternal Grandmother    Alzheimer's disease Maternal Grandmother    Heart disease Maternal Grandfather     Medications:   Current Outpatient Medications on File Prior to Visit  Medication Sig Dispense Refill   losartan (COZAAR) 50 MG tablet Take 100 mg by mouth daily.     QSYMIA 7.5-46 MG CP24 Take 1 capsule by mouth daily.     EPINEPHrine (EPIPEN 2-PAK) 0.3 mg/0.3 mL IJ SOAJ injection Inject 0.3 mg into the muscle as needed. (Patient not taking: Reported on 12/12/2022)     No  current facility-administered medications on file prior to visit.    Allergies:   Allergies  Allergen Reactions   Iohexol      Desc: Pt began having sob, with sneezing and immed vomiting post contrast (multihance), Onset Date: [id number]       OBJECTIVE:  Physical Exam  Vitals:   12/12/22 0907  BP: (!) 141/85  Pulse: 74  Weight: (!) 308 lb (139.7 kg)  Height: 6\' 1"  (1.854 m)   Body mass index is 40.64 kg/m. No results found.  General: well developed, well nourished, very pleasant middle-age Caucasian male, seated, in no evident distress Head: head normocephalic and atraumatic.   Neck: supple with no carotid or supraclavicular bruits Cardiovascular: regular rate and rhythm, no murmurs Musculoskeletal: no deformity Skin:  no rash/petichiae Vascular:  Normal pulses all extremities   Neurologic Exam Mental Status: Awake and fully alert. Oriented to place and time. Recent and remote memory intact. Attention span, concentration and fund of knowledge appropriate. Mood and affect appropriate.  Cranial Nerves: Pupils equal, briskly reactive to light. Extraocular movements full without nystagmus. Visual fields full to confrontation. Hearing intact. Facial sensation intact. Face, tongue, palate moves normally and symmetrically.  Motor: Normal bulk and tone. Normal strength in all tested extremity muscles Sensory.: intact to touch , pinprick , position and vibratory sensation.  Coordination: Rapid alternating movements normal in all extremities. Finger-to-nose and heel-to-shin performed accurately bilaterally. Gait and Station: Arises from chair without difficulty. Stance is normal. Gait demonstrates normal stride length and balance without use of AD. Reflexes: 1+ and symmetric. Toes downgoing.         ASSESSMENT/PLAN: Cameron Rivas is a 52 y.o. year old male    OSA on CPAP : Compliance report shows satisfactory usage with optimal residual AHI.  Difficulty tolerating mask and  pressure, will place order to DME for mask refitting or change of interface. Will change to AutoPap setting of 6-12 with EPR 3, will recheck download in 1 month to ensure apnea still well controlled. Discussed continued nightly usage with ensuring greater than 4 hours nightly for optimal benefit and per insurance purposes.  Continue to follow with DME company for any needed supplies or CPAP related concerns     Follow up in 6 months or call earlier if needed    CC:  PCP: Creola Corn, MD    I spent 35 minutes of face-to-face and non-face-to-face time with patient.  This included previsit chart review, lab review, study review, order entry, electronic health record documentation, patient education and discussion regarding above diagnoses and  treatment plan and answered all other questions to patient's satisfaction    Ihor Austin, [facility]  [facility] [address] [place], [place] [address]  Phone [phone] Fax [phone] Note: This document was prepared with digital dictation and possible smart phrase technology. Any transcriptional errors that result from this process are unintentional.

## 2022-12-12 NOTE — Patient Instructions (Addendum)
Your Plan:  Will switch your settings to autoPAP settings to see if this helps with tolerating pressure  Will send order to your DME company [employer] for mask refitting or change of mask      Follow up in 6 months or call earlier if needed     Thank you for coming to see [place] at [facility]. I hope we have been able to provide you high quality care today.  You may receive a patient satisfaction survey over the next few weeks. We would appreciate your feedback and comments so that we may continue to improve ourselves and the health of our patients.

## 2022-12-18 ENCOUNTER — Encounter: Payer: No Typology Code available for payment source | Admitting: Family Medicine

## 2023-01-13 ENCOUNTER — Telehealth: Payer: Self-pay | Admitting: Adult Health

## 2023-01-13 NOTE — Telephone Encounter (Signed)
Obtain repeat CPAP download over the past 30 days which shows apnea remains well-controlled on AutoPap settings.  Please follow-up with patient to see how he is doing on new pressure settings (previously on set pressure). Thank you.

## 2023-01-13 NOTE — Telephone Encounter (Signed)
Contacted pt, LVM rq call back  

## 2023-01-14 NOTE — Telephone Encounter (Signed)
Called patient and LVM/ pt answer machine state he's out of town and will be back on Nov 4.

## 2023-07-02 ENCOUNTER — Ambulatory Visit: Payer: No Typology Code available for payment source | Admitting: Adult Health

## 2023-07-02 ENCOUNTER — Encounter: Payer: Self-pay | Admitting: Adult Health

## 2023-07-02 VITALS — BP 136/85 | HR 88 | Ht 73.0 in | Wt 305.0 lb

## 2023-07-02 DIAGNOSIS — G4733 Obstructive sleep apnea (adult) (pediatric): Secondary | ICD-10-CM

## 2023-07-02 NOTE — Patient Instructions (Signed)
 Your Plan:  Continue nightly use of CPAP with greater than 4 hours per night for optimal benefit and per insurance requirements  Continue to follow with your DME company [employer] for any needs supplies or CPAP related concerns     Follow-up in 1 year or call earlier if needed     Thank you for coming to see us  at [facility]. I hope we have been able to provide you high quality care today.  You may receive a patient satisfaction survey over the next few weeks. We would appreciate your feedback and comments so that we may continue to improve ourselves and the health of our patients.

## 2023-07-02 NOTE — Progress Notes (Signed)
 [facility] [address] [place]. [place] [address] [phone]       OFFICE FOLLOW UP NOTE  [name] Date of Birth:  [birth year] Medical Record Number:  [id number]    Primary neurologist: Dr. Omar Bibber Reason for visit: CPAP follow-up    SUBJECTIVE:   CHIEF COMPLAINT:  Chief Complaint  Patient presents with   Obstructive Sleep Apnea    Rm 8 alone Pt is well and stable, reports no concerns with OSA/CPAP. Gets about 7-8 hrs of sleep a night.     Follow-up visit:  Prior visit: 12/12/2022  Brief HPI:   Cameron Rivas is a 53 y.o. male who was initially seen by Dr. Omar Bibber on 04/04/2022 for concern of underlying sleep apnea with complaints of snoring, excessive daytime somnolence, difficulty losing weight, and longstanding history of maintaining sleep.  ESS 9/24. FSS 45/63.  Sleep study 09/22/2022 showed severe sleep apnea, qualify for emergency split sleep study with baseline AHI 77.1/h and O2 nadir of 83%, responded well to CPAP treatment at pressure of 12 and EPR level 2.  CPAP initiated 10/16/2022.  DME [facility]  At prior visit, had difficulty tolerating hybrid type mask and difficulty tolerating CPAP pressure of 12.  Recommended follow-up with DME for change of interface and changed to AutoPap setting of 6-12 with EPR 3.   Interval history:  Returns today for follow-up CPAP visit.  Reports overall doing well on CPAP.  He continues to use hybrid type mask, he has found a certain position to sleep in where his mask will stay in position, will occasionally have leaks but will resolve after readjusting.  Still has occasional mouth dryness but not overly bothersome.  Feels like overall he is sleeping well and continues to see benefit.  ESS 6/24.  Feels he is tolerating AutoPap setting pressure better.  Routinely follows with DME [employer] [facility].       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)    PSH:  Past Surgical History:  Procedure Laterality Date   CARDIAC CATHETERIZATION     2010 or 2011   testicle Left    removed 2004   WISDOM TOOTH EXTRACTION     1993    Social History:  Social History   Socioeconomic History   Marital status: Married    Spouse name: Not on file   Number of children: Not on file   Years of education: Not on file   Highest education level: Not on file  Occupational History   Not on file  Tobacco Use   Smoking status: Never   Smokeless tobacco: Never  Vaping Use   Vaping status: Never Used  Substance and Sexual Activity   Alcohol use: Yes    Comment: 1 beer or 2 shot at night nightly   Drug use: Never   Sexual activity: Not on file  Other Topics Concern   Not on file  Social History Narrative   Caffiene coffee 12 cups daily, 5-6 cups unsweetened tea.    Work Systems developer, Counselling psychologist   Social Drivers of Corporate investment banker Strain: Not on file  Food Insecurity: Not on [employer]  Needs: Not on file  Physical Activity: Not on file  Stress: Not on file  Social Connections: Not on file  Intimate Partner Violence: Not on file    Family History:  Family History  Problem Relation Age of Onset   Asthma Mother    Diabetes Mellitus I Mother    Pancreatic cancer Mother    Kidney failure Mother        dialysis   Throat cancer Father    Diabetes Mellitus I Maternal Grandmother    Alzheimer's disease Maternal Grandmother    Heart disease Maternal Grandfather     Medications:   Current Outpatient Medications on File Prior to Visit  Medication Sig Dispense Refill   losartan (COZAAR) 50 MG tablet Take 100 mg by mouth daily.     tirzepatide (ZEPBOUND) 5 MG/0.5ML Pen Inject 5 mg into the skin once a week.     EPINEPHrine  (EPIPEN 2-PAK) 0.3 mg/0.3 mL IJ SOAJ injection Inject 0.3 mg into the muscle as needed. (Patient not taking: Reported on 07/02/2023)     QSYMIA 7.5-46 MG CP24 Take 1 capsule by mouth daily. (Patient not taking: Reported on 07/02/2023)     No current facility-administered medications on file prior to visit.    Allergies:   Allergies  Allergen Reactions   Iohexol      Desc: Pt began having sob, with sneezing and immed vomiting post contrast (multihance), Onset Date: [id number]       OBJECTIVE:  Physical Exam  Vitals:   07/02/23 1115  BP: 136/85  Pulse: 88  Weight: (!) 305 lb (138.3 kg)  Height: 6\' 1"  (1.854 m)    Body mass index is 40.24 kg/m. No results found.  General: well developed, well nourished, very pleasant middle-age Caucasian male, seated, in no evident distress Head: head normocephalic and atraumatic.   Neck: supple with no carotid or supraclavicular bruits Cardiovascular: regular rate and rhythm, no murmurs Musculoskeletal: no deformity Skin:  no rash/petichiae Vascular:  Normal pulses all extremities   Neurologic Exam Mental Status: Awake and fully alert. Oriented to place and time. Recent and remote memory intact. Attention span, concentration and fund of knowledge appropriate. Mood and affect appropriate.  Cranial Nerves: Pupils equal, briskly reactive to light. Extraocular movements full without nystagmus. Visual fields full to confrontation. Hearing intact. Facial sensation intact. Face, tongue, palate moves normally and symmetrically.  Motor: Normal bulk and tone. Normal strength in all tested extremity muscles Gait and Station: Arises from chair without difficulty. Stance is normal. Gait demonstrates normal stride length and balance without use of AD.         ASSESSMENT/PLAN: [name] is a 53 y.o. year old male    OSA on CPAP : Compliance report shows satisfactory usage with optimal residual AHI.  Continue current pressure settings of 6-12  with EPR 3. Discussed continued nightly usage with ensuring greater than 4 hours nightly for optimal benefit and per insurance purposes.  Continue to follow with DME company for any needed supplies or CPAP related concerns     Follow up in 1 year or call earlier if needed    CC:  PCP: Margarete Sharps, MD    I spent 15 minutes of face-to-face and non-face-to-face time with patient.  This included previsit chart review, lab review, study review, order entry, electronic health record documentation, patient education and discussion regarding above diagnoses and treatment plan and answered all other questions to patient's satisfaction  Johny Nap, AGNP-BC  [facility]  [address] [place], [place] [address]  Phone [phone] Fax [phone] Note: This document was prepared with digital dictation and possible smart phrase technology. Any transcriptional errors that result from this process are unintentional.

## 2023-07-02 NOTE — Progress Notes (Signed)
 Shanyce Daris D, CMA  Vergia Glasgow Bradley; Garcia, Patricia; Ziegler, Melissa; Tucker, Dolanda; Cain, Jasper New orders have been placed for the above pt, DOB: [birth year] Thanks

## 2024-07-01 ENCOUNTER — Ambulatory Visit: Admitting: Adult Health
